# Patient Record
Sex: Female | Born: 1970 | Race: White | Hispanic: No | Marital: Married | Smoking: Former smoker
Health system: Southern US, Community
[De-identification: ages and names within clinical notes are randomized; demographics above are authoritative.]

## PROBLEM LIST (undated history)

## (undated) DIAGNOSIS — M329 Systemic lupus erythematosus, unspecified: Secondary | ICD-10-CM

## (undated) DIAGNOSIS — M1990 Unspecified osteoarthritis, unspecified site: Secondary | ICD-10-CM

## (undated) DIAGNOSIS — F32A Depression, unspecified: Secondary | ICD-10-CM

## (undated) DIAGNOSIS — E039 Hypothyroidism, unspecified: Secondary | ICD-10-CM

## (undated) DIAGNOSIS — M797 Fibromyalgia: Secondary | ICD-10-CM

## (undated) DIAGNOSIS — F988 Other specified behavioral and emotional disorders with onset usually occurring in childhood and adolescence: Secondary | ICD-10-CM

## (undated) DIAGNOSIS — I639 Cerebral infarction, unspecified: Secondary | ICD-10-CM

## (undated) DIAGNOSIS — F419 Anxiety disorder, unspecified: Secondary | ICD-10-CM

## (undated) DIAGNOSIS — T8859XA Other complications of anesthesia, initial encounter: Secondary | ICD-10-CM

## (undated) DIAGNOSIS — K5090 Crohn's disease, unspecified, without complications: Secondary | ICD-10-CM

## (undated) DIAGNOSIS — IMO0002 Reserved for concepts with insufficient information to code with codable children: Secondary | ICD-10-CM

## (undated) DIAGNOSIS — F329 Major depressive disorder, single episode, unspecified: Secondary | ICD-10-CM

## (undated) HISTORY — DX: Depression, unspecified: F32.A

## (undated) HISTORY — PX: ABDOMINAL HYSTERECTOMY: SHX81

## (undated) HISTORY — PX: ANKLE SURGERY: SHX546

## (undated) HISTORY — PX: ELBOW SURGERY: SHX618

## (undated) HISTORY — PX: HAND SURGERY: SHX662

## (undated) HISTORY — DX: Other specified behavioral and emotional disorders with onset usually occurring in childhood and adolescence: F98.8

## (undated) HISTORY — DX: Reserved for concepts with insufficient information to code with codable children: IMO0002

## (undated) HISTORY — DX: Major depressive disorder, single episode, unspecified: F32.9

## (undated) HISTORY — DX: Anxiety disorder, unspecified: F41.9

---

## 1995-07-13 HISTORY — PX: CHOLECYSTECTOMY: SHX55

## 1998-07-12 DIAGNOSIS — M797 Fibromyalgia: Secondary | ICD-10-CM

## 1998-07-12 HISTORY — DX: Fibromyalgia: M79.7

## 2000-07-12 DIAGNOSIS — K5090 Crohn's disease, unspecified, without complications: Secondary | ICD-10-CM

## 2000-07-12 HISTORY — DX: Crohn's disease, unspecified, without complications: K50.90

## 2006-07-12 HISTORY — PX: TOTAL ABDOMINAL HYSTERECTOMY W/ BILATERAL SALPINGOOPHORECTOMY: SHX83

## 2010-07-12 DIAGNOSIS — I639 Cerebral infarction, unspecified: Secondary | ICD-10-CM

## 2010-07-12 HISTORY — DX: Cerebral infarction, unspecified: I63.9

## 2012-03-24 ENCOUNTER — Encounter (HOSPITAL_COMMUNITY): Payer: Self-pay | Admitting: Emergency Medicine

## 2012-03-24 ENCOUNTER — Emergency Department (INDEPENDENT_AMBULATORY_CARE_PROVIDER_SITE_OTHER)

## 2012-03-24 ENCOUNTER — Emergency Department (INDEPENDENT_AMBULATORY_CARE_PROVIDER_SITE_OTHER)
Admission: EM | Admit: 2012-03-24 | Discharge: 2012-03-24 | Disposition: A | Discharge status: Home / Self Care | Source: Home / Self Care

## 2012-03-24 DIAGNOSIS — M778 Other enthesopathies, not elsewhere classified: Secondary | ICD-10-CM

## 2012-03-24 DIAGNOSIS — S7000XA Contusion of unspecified hip, initial encounter: Secondary | ICD-10-CM

## 2012-03-24 DIAGNOSIS — M65849 Other synovitis and tenosynovitis, unspecified hand: Secondary | ICD-10-CM

## 2012-03-24 DIAGNOSIS — M549 Dorsalgia, unspecified: Secondary | ICD-10-CM

## 2012-03-24 DIAGNOSIS — S300XXA Contusion of lower back and pelvis, initial encounter: Secondary | ICD-10-CM

## 2012-03-24 DIAGNOSIS — M65839 Other synovitis and tenosynovitis, unspecified forearm: Secondary | ICD-10-CM

## 2012-03-24 HISTORY — DX: Fibromyalgia: M79.7

## 2012-03-24 HISTORY — DX: Crohn's disease, unspecified, without complications: K50.90

## 2012-03-24 HISTORY — DX: Cerebral infarction, unspecified: I63.9

## 2012-03-24 MED ORDER — KETOROLAC TROMETHAMINE 60 MG/2ML IM SOLN
60.0000 mg | Freq: Once | INTRAMUSCULAR | Status: AC
  Administered 2012-03-24: 60 mg via INTRAMUSCULAR

## 2012-03-24 MED ORDER — CYCLOBENZAPRINE HCL 10 MG PO TABS: 10.0000 mg | ORAL_TABLET | Freq: Two times a day (BID) | ORAL | Status: AC | PRN

## 2012-03-24 MED ORDER — KETOROLAC TROMETHAMINE 60 MG/2ML IM SOLN
INTRAMUSCULAR | Status: AC
  Filled 2012-03-24: qty 2

## 2012-03-24 NOTE — ED Notes (Signed)
Pt c/o lower back pain x1 week... Says that on 03/11/12 she fell of a truck and landed on her bottom... Says she had a bruise on her left glut... Since then, she's noticed pain that shoots downward to her left leg and left foot... Has some numbness and weakness on her left leg... Has been limping and not been able to sleep well...  She's also c/o pain on her left wrist.

## 2012-03-24 NOTE — ED Provider Notes (Signed)
History     CSN: [id number]  Arrival date & time 03/24/12  1146   None     Chief Complaint  Patient presents with  . Back Pain    (Consider location/radiation/quality/duration/timing/severity/associated sxs/prior treatment) HPI Comments: Hx as per RN note: fell nearly 2 weeks ago onto buttocks off a truck. Received a large black bruise to the L  Buttock. She complains of persistent pain at base of spine/coccyx, L paralumbosacral musculature. The pain radiates to L anterior  Thigh pain, sharp shooting. Difficult to walk and utilize back due to pain. All movements make it worse.   L wrist pain for 2 weeks. Does not know if injured it or not. Sore to use it but with full ROM.    Past Medical History  Diagnosis Date  . Fibromyalgia   . Crohn's disease   . Stroke     Past Surgical History  Procedure Date  . Cholecystectomy   . Hand surgery   . Elbow surgery   . Ankle surgery   . Total abdominal hysterectomy w/ bilateral salpingoophorectomy     No family history on file.  History  Substance Use Topics  . Smoking status: Not on file  . Smokeless tobacco: Not on file  . Alcohol Use:     OB History    Grav Para Term Preterm Abortions TAB SAB Ect Mult Living                  Review of Systems  Constitutional: Positive for activity change. Negative for fever.  HENT: Negative.   Respiratory: Negative.   Gastrointestinal: Negative.   Genitourinary: Negative.   Musculoskeletal:       As in HPI   Skin: Negative.   Neurological: Positive for weakness. Negative for dizziness, tremors, syncope, facial asymmetry and headaches.    Allergies  Codeine; Geodon; and Latex  Home Medications   Current Outpatient Rx  Name Route Sig Dispense Refill  . CYCLOBENZAPRINE HCL 10 MG PO TABS Oral Take 1 tablet (10 mg total) by mouth 2 (two) times daily as needed for muscle spasms. 20 tablet 0    BP 121/73  Pulse 80  Temp 98.8 F (37.1 C) (Oral)  Resp 18  SpO2  100%  Physical Exam  Constitutional: She is oriented to person, place, and time. She appears well-developed and well-nourished. No distress.  Eyes: EOM are normal. Pupils are equal, round, and reactive to light.  Neck: Normal range of motion. Neck supple.  Pulmonary/Chest: Effort normal.  Musculoskeletal:       Decrease ROM of low back and L LE. Tenderness in L lower back, buttock musculature and over the coccyx. Ambulatory with a limp. Unable to lift L thigh while standing, more than a few inches.   Neurological: She is alert and oriented to person, place, and time.  Skin: Skin is warm and dry. No erythema.    ED Course  Procedures (including critical care time)  Labs Reviewed - No data to display Dg Sacrum/coccyx  03/24/2012  *RADIOLOGY REPORT*  Clinical Data: Back pain after fall  SACRUM AND COCCYX - 2+ VIEW  Comparison: None.  Findings: Sacroiliac joints are within normal limits.  Sacral alae are intact.  Normal sacral and coccygeal segmentation.  No overlying radiopaque foreign body.  IMPRESSION: No fracture identified.   Original Report Authenticated By: Harrel Lemon, M.D.    Dg Hip Complete Left  03/24/2012  *RADIOLOGY REPORT*  Clinical Data: Fall, left hip pain  LEFT  HIP - COMPLETE 2+ VIEW  Comparison: None.  Findings: Presumed bowel content over the left sacrum.  Mild left hip degenerative change identified.  No fracture or dislocation. No sacroiliac diastasis.  IMPRESSION: No left hip fracture or dislocation.   Original Report Authenticated By: Harrel Lemon, M.D.      1. Contusion, hip   2. Contusion of multiple sites of buttock   3. Back pain   4. Tendinitis of left wrist       MDM  Dg Sacrum/coccyx  03/24/2012  *RADIOLOGY REPORT*  Clinical Data: Back pain after fall  SACRUM AND COCCYX - 2+ VIEW  Comparison: None.  Findings: Sacroiliac joints are within normal limits.  Sacral alae are intact.  Normal sacral and coccygeal segmentation.  No overlying radiopaque  foreign body.  IMPRESSION: No fracture identified.   Original Report Authenticated By: Harrel Lemon, M.D.    Dg Hip Complete Left  03/24/2012  *RADIOLOGY REPORT*  Clinical Data: Fall, left hip pain  LEFT HIP - COMPLETE 2+ VIEW  Comparison: None.  Findings: Presumed bowel content over the left sacrum.  Mild left hip degenerative change identified.  No fracture or dislocation. No sacroiliac diastasis.  IMPRESSION: No left hip fracture or dislocation.   Original Report Authenticated By: Harrel Lemon, M.D.    Motrin for pain Flexeril bid prn.  L wrist splint Crutches for 4-5 days with gradual wt bearing as tolerated.          Hayden Rasmussen, NP 03/24/12 1410

## 2012-03-25 NOTE — ED Provider Notes (Signed)
Medical screening examination/treatment/procedure(s) were performed by non-physician practitioner and as supervising physician I was immediately available for consultation/collaboration.  Andruw Battie   Aynslee Mulhall, MD 03/25/12 1144 

## 2013-02-20 ENCOUNTER — Other Ambulatory Visit: Payer: Self-pay | Admitting: Family Medicine

## 2013-02-20 DIAGNOSIS — G459 Transient cerebral ischemic attack, unspecified: Secondary | ICD-10-CM

## 2013-02-23 ENCOUNTER — Ambulatory Visit
Admission: RE | Admit: 2013-02-23 | Discharge: 2013-02-23 | Disposition: A | Discharge status: Home / Self Care | Source: Ambulatory Visit | Attending: Family Medicine | Admitting: Family Medicine

## 2013-02-23 ENCOUNTER — Other Ambulatory Visit

## 2013-02-23 DIAGNOSIS — G459 Transient cerebral ischemic attack, unspecified: Secondary | ICD-10-CM

## 2013-09-21 ENCOUNTER — Encounter: Payer: Self-pay | Admitting: Gynecology

## 2013-09-21 ENCOUNTER — Ambulatory Visit (INDEPENDENT_AMBULATORY_CARE_PROVIDER_SITE_OTHER): Payer: BC Managed Care – PPO | Admitting: Gynecology

## 2013-09-21 VITALS — BP 104/75 | HR 82 | Resp 16 | Ht 61.5 in | Wt 159.0 lb

## 2013-09-21 DIAGNOSIS — E2839 Other primary ovarian failure: Secondary | ICD-10-CM

## 2013-09-21 DIAGNOSIS — F32A Depression, unspecified: Secondary | ICD-10-CM | POA: Insufficient documentation

## 2013-09-21 DIAGNOSIS — Z0000 Encounter for general adult medical examination without abnormal findings: Secondary | ICD-10-CM

## 2013-09-21 DIAGNOSIS — F329 Major depressive disorder, single episode, unspecified: Secondary | ICD-10-CM | POA: Insufficient documentation

## 2013-09-21 DIAGNOSIS — K5090 Crohn's disease, unspecified, without complications: Secondary | ICD-10-CM

## 2013-09-21 DIAGNOSIS — Z01419 Encounter for gynecological examination (general) (routine) without abnormal findings: Secondary | ICD-10-CM

## 2013-09-21 DIAGNOSIS — IMO0002 Reserved for concepts with insufficient information to code with codable children: Secondary | ICD-10-CM

## 2013-09-21 LAB — POCT URINALYSIS DIPSTICK
PH UA: 5
UROBILINOGEN UA: NEGATIVE

## 2013-09-21 MED ORDER — LIDOCAINE 5 % EX OINT
1.0000 | TOPICAL_OINTMENT | Freq: Three times a day (TID) | CUTANEOUS | Status: DC
Start: 2013-09-21 — End: 2015-12-10

## 2013-09-21 NOTE — Progress Notes (Signed)
Nichole Aguilar   [name] here with spouse. Pt is not currently sexually active.  Pt referred for dyspareunia.  Pt reports symptoms have been progressive and not cannot attain vaginal penetration.  She has had 4 children vaginally. They have not tried lubricants, but have.  Pt describes pain as "shard of glass" Pt is s/p LAVH for recurrent ovarian cysts in 2007, and was on estradiol until 2012.  Pt stopped due to stroke-twice both with left sided with almost full return to normal.  Pt reports some aphasia still and wonders if she has mini TIA's.   Pt intermittent breast exams, does have mammograms.  canot recall if she had full PE including breast recently.  No LMP recorded. Patient has had a hysterectomy.          Sexually active: no  The current method of family planning is status post hysterectomy.    Exercising: no   Last pap: Alcohol: no Tobacco: no BSE: no Mammogram: 2014  Hgb: not done yet ; Urine: Leuks 1    No health maintenance topics applied.  Family History  Problem Relation Age of Onset  . Hodgkin's lymphoma Mother   . Breast cancer Maternal Aunt   . Hypertension Father   . Osteoporosis      Paternal side    Patient Active Problem List   Diagnosis Date Noted  . Depression     Past Medical History  Diagnosis Date  . Fibromyalgia   . Crohn's disease   . Stroke   . Depression   . Anxiety   . Dyspareunia     Past Surgical History  Procedure Laterality Date  . Cholecystectomy    . Hand surgery    . Elbow surgery    . Ankle surgery    . Total abdominal hysterectomy w/ bilateral salpingoophorectomy  2008    Allergies: Codeine; Geodon; and Latex  Current Outpatient Prescriptions  Medication Sig Dispense Refill  . amphetamine-dextroamphetamine (ADDERALL) 20 MG tablet Take 20 mg by mouth 2 (two) times daily.      Marland Kitchen. ALPRAZolam (XANAX) 1 MG tablet as needed.        No current facility-administered medications for this visit.    ROS:  Pertinent items are noted in HPI.  Exam:    BP 104/75  Pulse 82  Resp 16  Ht 5' 1.5" (1.562 m)  Wt 159 lb (72.122 kg)  BMI 29.56 kg/m2 Weight change: @WEIGHTCHANGE @ Last 3 height recordings:  Ht Readings from Last 3 Encounters:  09/21/13 5' 1.5" (1.562 m)   General appearance: alert, cooperative and appears older than stated age, she is at times slow with responses and unsure of response. Head: Normocephalic, without obvious abnormality, atraumatic Lungs: clear to auscultation bilaterally Breasts: normal appearance, no masses or tenderness Heart: regular rate and rhythm, S1, S2 normal, no murmur, click, rub or gallop Abdomen: soft, non-tender; bowel sounds normal; no masses,  no organomegaly Extremities: nodes noted on fingers, no skin discoloration, tender Skin: Skin color, texture, turgor normal. No rashes or lesions Lymph nodes: Cervical, supraclavicular, and axillary nodes normal. no inguinal nodes palpated Neurologic: Grossly normal   Pelvic: External genitalia:  no lesions              Urethra: normal appearing urethra with no masses, tenderness or lesions              Bartholins and Skenes: Bartholin's, Urethra, Skene's normal  Vagina: diffuse tenderness limiting exam, petechiae noted, tenderness more on left side than right, narrow introitus              Cervix:absent                      Bimanual Exam:  Uterus:  absent                                      Adnexa:    no masses                                      Rectovaginal: ridge c/w posterior repair tender diffusely, levator tenderness left greater than right                                      Anus:  normal sphincter tone, no lesions  A: dyspareunia hypoestrogen state Crohn's disease  P: we had a long discussion regarding the differential for her pain which may be multifactorial.  She is hypoestrogenic, in addition, she has not been on medications for her crohn's disease in over 2y and she has had  muscular weakness on the left after her stokes all of which can contribute to her pain.  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  Her exam was limited by tenderness.  We have prescribed lidocaine ointment and have asked her to bring it back for a follow up exam.  Because of her strokes, I would not recommend vaginal estrogen at this time.  We discussed cocoanut oil as a lubricant.   We referred her to GI for management of her Crohn's. We requested records regarding We discussed many treatment options but will be more specific once we have all of her records   An After Visit Summary was printed and given to the patient.

## 2013-09-21 NOTE — Patient Instructions (Signed)
Follow up with GI Return with lidocaine ointment for f/u visit Call before office visit to assure records in

## 2013-10-01 ENCOUNTER — Telehealth: Payer: Self-pay | Admitting: Gynecology

## 2013-10-01 NOTE — Telephone Encounter (Signed)
Per fax received from Dr Trilby DrummerManns office. Patient is scheduled 04.07.2015 @ 1045. Patient has been advised

## 2013-10-19 ENCOUNTER — Encounter: Payer: Self-pay | Admitting: Gynecology

## 2013-10-19 ENCOUNTER — Telehealth: Payer: Self-pay | Admitting: Gynecology

## 2013-10-19 ENCOUNTER — Ambulatory Visit: Payer: BC Managed Care – PPO | Admitting: Gynecology

## 2013-10-19 NOTE — Telephone Encounter (Signed)
Patient dnka her 4 week reck appointment today. I spoke with patient she rescheduled to 11/02/2013 @ 2:30 with Dr.Lathrop.

## 2013-10-22 NOTE — Telephone Encounter (Signed)
Never got her old records, can we resend out the request? TL

## 2013-11-02 ENCOUNTER — Encounter: Payer: Self-pay | Admitting: Gynecology

## 2013-11-02 ENCOUNTER — Encounter: Payer: BC Managed Care – PPO | Admitting: Gynecology

## 2013-11-06 ENCOUNTER — Ambulatory Visit (INDEPENDENT_AMBULATORY_CARE_PROVIDER_SITE_OTHER): Payer: BC Managed Care – PPO | Admitting: Gynecology

## 2013-11-06 VITALS — BP 102/64 | HR 68 | Resp 16 | Ht 61.5 in | Wt 162.0 lb

## 2013-11-06 DIAGNOSIS — K5090 Crohn's disease, unspecified, without complications: Secondary | ICD-10-CM

## 2013-11-06 DIAGNOSIS — I639 Cerebral infarction, unspecified: Secondary | ICD-10-CM

## 2013-11-06 DIAGNOSIS — I6350 Cerebral infarction due to unspecified occlusion or stenosis of unspecified cerebral artery: Secondary | ICD-10-CM

## 2013-11-06 DIAGNOSIS — IMO0002 Reserved for concepts with insufficient information to code with codable children: Secondary | ICD-10-CM

## 2013-11-06 DIAGNOSIS — E2839 Other primary ovarian failure: Secondary | ICD-10-CM

## 2013-11-06 MED ORDER — LIDOCAINE HCL 2 % EX GEL: 1.0000 "application " | CUTANEOUS | Status: DC | PRN

## 2013-11-06 NOTE — Progress Notes (Signed)
Pt his here for a limited exam with anesthesia.  She has her lidocaine ointment 5%. We have not yet gotten her old reocrds of her stroke or LAVH-BSO.  Pt and husband have not been able to have vaginal penetration.    Pt presented with lidocaine-peppermint oil noted on package Vagina treated with 2% lidocaine jelly. After some time, pelvic exam able to be performed.   External genitalia:  See above, raw area noted posteriorly BUS: negative Vagina: pale, thin with petechiae, no gross lesions, no discharge, no malodor, anterior vaginal tenderness Uterus, cervix surgically absent Adnexa negative: bilateral tenderness  Rectovaginal:  exquisite tenderness  Assessment: Post-menopausal vaginal atrophy Crohn's disease not treated Stroke Fibromyalgia  Plan; By anesthetizing the vagina we were able to determine that her pelvic pain is rectal in source Strongly recommend she f/u with GI to restart treatment and evaluation of her crohn's  Pt aware that crohns can be source of her stroke, she cannot use estrogen however due to coagulable state Recommend returning xylocaine for non-mint variety, can use with condoms for intercourse, 2% rx given today. Vaginal

## 2013-11-21 ENCOUNTER — Ambulatory Visit: Payer: BC Managed Care – PPO | Admitting: Neurology

## 2013-12-07 NOTE — Progress Notes (Signed)
appt canceled after pt presented, did not have medication with her

## 2013-12-13 ENCOUNTER — Encounter (INDEPENDENT_AMBULATORY_CARE_PROVIDER_SITE_OTHER): Payer: Self-pay

## 2013-12-13 ENCOUNTER — Encounter: Payer: Self-pay | Admitting: Neurology

## 2013-12-13 ENCOUNTER — Ambulatory Visit (INDEPENDENT_AMBULATORY_CARE_PROVIDER_SITE_OTHER): Payer: BC Managed Care – PPO | Admitting: Neurology

## 2013-12-13 VITALS — BP 114/77 | HR 88 | Resp 16 | Ht 62.0 in | Wt 162.0 lb

## 2013-12-13 DIAGNOSIS — F5105 Insomnia due to other mental disorder: Secondary | ICD-10-CM

## 2013-12-13 DIAGNOSIS — F489 Nonpsychotic mental disorder, unspecified: Secondary | ICD-10-CM

## 2013-12-13 DIAGNOSIS — K5090 Crohn's disease, unspecified, without complications: Secondary | ICD-10-CM | POA: Insufficient documentation

## 2013-12-13 DIAGNOSIS — Z9189 Other specified personal risk factors, not elsewhere classified: Secondary | ICD-10-CM

## 2013-12-13 DIAGNOSIS — G4750 Parasomnia, unspecified: Secondary | ICD-10-CM

## 2013-12-13 DIAGNOSIS — F409 Phobic anxiety disorder, unspecified: Secondary | ICD-10-CM

## 2013-12-13 NOTE — Patient Instructions (Signed)
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
## 2013-12-25 ENCOUNTER — Ambulatory Visit
Admission: RE | Admit: 2013-12-25 | Discharge: 2013-12-25 | Disposition: A | Discharge status: Home / Self Care | Payer: BC Managed Care – PPO | Source: Ambulatory Visit | Attending: Neurology | Admitting: Neurology

## 2013-12-25 DIAGNOSIS — F409 Phobic anxiety disorder, unspecified: Secondary | ICD-10-CM

## 2013-12-25 DIAGNOSIS — F5105 Insomnia due to other mental disorder: Secondary | ICD-10-CM

## 2013-12-25 DIAGNOSIS — Z9189 Other specified personal risk factors, not elsewhere classified: Secondary | ICD-10-CM

## 2013-12-25 DIAGNOSIS — G4750 Parasomnia, unspecified: Secondary | ICD-10-CM

## 2013-12-25 DIAGNOSIS — K5090 Crohn's disease, unspecified, without complications: Secondary | ICD-10-CM

## 2013-12-25 MED ORDER — GADOBENATE DIMEGLUMINE 529 MG/ML IV SOLN
15.0000 mL | Freq: Once | INTRAVENOUS | Status: AC | PRN
  Administered 2013-12-25: 15 mL via INTRAVENOUS

## 2014-01-04 NOTE — Progress Notes (Signed)
Quick Note:  Spoke to patient and relayed MRI brain results, per Dr. Dohmeier. ______ 

## 2014-01-30 ENCOUNTER — Ambulatory Visit (INDEPENDENT_AMBULATORY_CARE_PROVIDER_SITE_OTHER): Payer: BC Managed Care – PPO | Admitting: Neurology

## 2014-01-30 ENCOUNTER — Encounter: Payer: Self-pay | Admitting: Neurology

## 2014-01-30 DIAGNOSIS — G4700 Insomnia, unspecified: Secondary | ICD-10-CM

## 2014-01-30 DIAGNOSIS — F5105 Insomnia due to other mental disorder: Secondary | ICD-10-CM

## 2014-01-30 DIAGNOSIS — F409 Phobic anxiety disorder, unspecified: Secondary | ICD-10-CM

## 2014-01-30 DIAGNOSIS — G4750 Parasomnia, unspecified: Secondary | ICD-10-CM

## 2014-01-30 DIAGNOSIS — R0989 Other specified symptoms and signs involving the circulatory and respiratory systems: Secondary | ICD-10-CM

## 2014-01-30 DIAGNOSIS — R0609 Other forms of dyspnea: Secondary | ICD-10-CM

## 2014-01-30 DIAGNOSIS — K5090 Crohn's disease, unspecified, without complications: Secondary | ICD-10-CM

## 2014-02-04 ENCOUNTER — Ambulatory Visit: Payer: BC Managed Care – PPO | Admitting: Neurology

## 2014-02-07 ENCOUNTER — Ambulatory Visit: Payer: Self-pay | Admitting: Neurology

## 2014-02-09 ENCOUNTER — Telehealth: Payer: Self-pay | Admitting: Neurology

## 2014-02-09 NOTE — Telephone Encounter (Signed)
Called the patient and left a message on voicemail informing her that her sleep study results were normal.  The test did not show or reveal any obstructive sleep apnea.  A copy of this report was sent to Dr. Douglass Riversracy Lathrop and will be mailed to the pt's home address.

## 2014-02-19 ENCOUNTER — Ambulatory Visit: Payer: BC Managed Care – PPO | Admitting: Neurology

## 2014-03-04 ENCOUNTER — Encounter: Payer: Self-pay | Admitting: Neurology

## 2014-04-01 ENCOUNTER — Emergency Department (HOSPITAL_COMMUNITY)
Admission: EM | Admit: 2014-04-01 | Discharge: 2014-04-01 | Disposition: A | Discharge status: Home / Self Care | Payer: BC Managed Care – PPO | Attending: Emergency Medicine | Admitting: Emergency Medicine

## 2014-04-01 ENCOUNTER — Emergency Department (HOSPITAL_COMMUNITY): Payer: BC Managed Care – PPO

## 2014-04-01 ENCOUNTER — Encounter (HOSPITAL_COMMUNITY): Payer: Self-pay | Admitting: Emergency Medicine

## 2014-04-01 DIAGNOSIS — Z79899 Other long term (current) drug therapy: Secondary | ICD-10-CM | POA: Insufficient documentation

## 2014-04-01 DIAGNOSIS — M25579 Pain in unspecified ankle and joints of unspecified foot: Secondary | ICD-10-CM | POA: Diagnosis present

## 2014-04-01 DIAGNOSIS — F411 Generalized anxiety disorder: Secondary | ICD-10-CM | POA: Insufficient documentation

## 2014-04-01 DIAGNOSIS — F329 Major depressive disorder, single episode, unspecified: Secondary | ICD-10-CM | POA: Diagnosis not present

## 2014-04-01 DIAGNOSIS — M25476 Effusion, unspecified foot: Secondary | ICD-10-CM | POA: Insufficient documentation

## 2014-04-01 DIAGNOSIS — Z9889 Other specified postprocedural states: Secondary | ICD-10-CM | POA: Diagnosis not present

## 2014-04-01 DIAGNOSIS — Z8742 Personal history of other diseases of the female genital tract: Secondary | ICD-10-CM | POA: Insufficient documentation

## 2014-04-01 DIAGNOSIS — Z8719 Personal history of other diseases of the digestive system: Secondary | ICD-10-CM | POA: Insufficient documentation

## 2014-04-01 DIAGNOSIS — M25571 Pain in right ankle and joints of right foot: Secondary | ICD-10-CM

## 2014-04-01 DIAGNOSIS — M25473 Effusion, unspecified ankle: Secondary | ICD-10-CM | POA: Diagnosis not present

## 2014-04-01 DIAGNOSIS — Z91040 Latex allergy status: Secondary | ICD-10-CM | POA: Insufficient documentation

## 2014-04-01 DIAGNOSIS — F3289 Other specified depressive episodes: Secondary | ICD-10-CM | POA: Diagnosis not present

## 2014-04-01 DIAGNOSIS — Z8673 Personal history of transient ischemic attack (TIA), and cerebral infarction without residual deficits: Secondary | ICD-10-CM | POA: Diagnosis not present

## 2014-04-01 LAB — CBG MONITORING, ED: Glucose-Capillary: 90 mg/dL (ref 70–99)

## 2014-04-01 MED ORDER — OXYCODONE-ACETAMINOPHEN 5-325 MG PO TABS: ORAL_TABLET | ORAL | Status: DC

## 2014-04-01 MED ORDER — PROMETHAZINE HCL 25 MG PO TABS: 25.0000 mg | ORAL_TABLET | Freq: Four times a day (QID) | ORAL | Status: DC | PRN

## 2014-04-01 MED ORDER — ONDANSETRON 4 MG PO TBDP
4.0000 mg | ORAL_TABLET | Freq: Once | ORAL | Status: AC
  Administered 2014-04-01: 4 mg via ORAL
  Filled 2014-04-01: qty 1

## 2014-04-01 MED ORDER — OXYCODONE-ACETAMINOPHEN 5-325 MG PO TABS
1.0000 | ORAL_TABLET | Freq: Once | ORAL | Status: AC
  Administered 2014-04-01: 1 via ORAL
  Filled 2014-04-01: qty 1

## 2014-04-01 NOTE — Discharge Instructions (Signed)
Take percocet for breakthrough pain, do not drink alcohol, drive, care for children or do other critical tasks while taking percocet.  Do not weight bear until you're cleared by the orthopedist Dr. August Saucer.  Do not hesitate to return to the emergency room for any new, worsening or concerning symptoms.  Please obtain primary care using resource guide below. But the minute you were seen in the emergency room and that they will need to obtain records for further outpatient management.     Emergency Department Resource Guide 1) Find a Doctor and Pay Out of Pocket Although you won't have to find out who is covered by your insurance plan, it is a good idea to ask around and get recommendations. You will then need to call the office and see if the doctor you have chosen will accept you as a new patient and what types of options they offer for patients who are self-pay. Some doctors offer discounts or will set up payment plans for their patients who do not have insurance, but you will need to ask so you aren't surprised when you get to your appointment.  2) Contact Your Local Health Department Not all health departments have doctors that can see patients for sick visits, but many do, so it is worth a call to see if yours does. If you don't know where your local health department is, you can check in your phone book. The CDC also has a tool to help you locate your state's health department, and many state websites also have listings of all of their local health departments.  3) Find a Walk-in Clinic If your illness is not likely to be very severe or complicated, you may want to try a walk in clinic. These are popping up all over the country in pharmacies, drugstores, and shopping centers. They're usually staffed by nurse practitioners or physician assistants that have been trained to treat common illnesses and complaints. They're usually fairly quick and inexpensive. However, if you have serious medical  issues or chronic medical problems, these are probably not your best option.  No Primary Care Doctor: - Call Health Connect at  [phone] - they can help you locate a primary care doctor that  accepts your insurance, provides certain services, etc. - Physician Referral Service- [phone]  Chronic Pain Problems: Organization         Address  Phone   Notes  [facility]  [phone] Patients need to be referred by their primary care doctor.   Medication Assistance: Organization         Address  Phone   Notes  [facility] Medication [facility] [address] [place], [place] [address] [phone] --Must be a resident of [facility] -- Must have NO insurance coverage whatsoever (no Medicaid/ Medicare, etc.) -- The pt. MUST have a primary care doctor that directs their care regularly and follows them in the community   [facility]  [phone]   [employer]  [phone]    Agencies that provide inexpensive medical care: Organization         Address  Phone   Notes  [facility]  [phone]   [facility]    [phone]   [facility] [address] [place], [place] [address] [phone]   [facility] [address] [place] [place] [phone]   [facility]anned Parenthood    [phone]  [facility]    [phone]   [facility]  [address], [place] Phone:  [phone], Fax:  [phone] Hours of Operation:  9 am - 6 pm, M-F.  Also accepts Medicaid/Medicare and self-pay.  [facility] for [place], [address], [place] Phone: [phone], Fax: [phone]. Hours of Operation:  8:30 am - 5:30 pm, M-F.  Also accepts Medicaid and self-pay.  [facility] [place] [address], [place] Phone: [phone]   [place], [place], [place]  [phone], Ext. [phone] Mondays & Thursdays: 7-9 AM.  First 15 patients are seen on a first come, first serve basis.    [place] Providers:  Organization         Address  Phone   Notes  [facility] [address], [place] [phone] Also accepts self-pay patients.  [facility] [address], [place]  [phone]   [place], [address], [place] [phone]   [facility] [address] [place] [phone]   Lucianne Lei [address], [place]   [phone] Only accepts [place] Access [place] patients after they have their name applied to their card.   Self-Pay (no insurance) in [facility]:  Organization         Address  Phone   Notes  Sickle Cell Patients, [facility] [place] [phone]   [facility] [place] [phone]   [facility] [place]  [place], [place], [place] [phone]   [facility]/Dr. Osei-Bonsu  [address] [place] or [address], [place] [phone] Phone number for both [place] and [place] locations is the same.  [facility] [address] [place] [phone]   [facility] [address] [place] or [address] [phone]   [facility] [address], [place] [phone], phone; [phone], fax Sees patients 1st and 3rd Saturday of every month.  Must not qualify for public or private insurance (i.e. Medicaid, Medicare, [place] Health Choice, Veterans' Benefits)  Household income should be no more than 200% of the poverty level The clinic cannot treat you if you are pregnant or think you are pregnant  Sexually transmitted  diseases are not treated at the clinic.    Dental Care: Organization         Address  Phone  Notes  [facility] [place] [phone] Accepts children up to age 15 who are enrolled in [place] or [place]; pregnant women with a Medicaid card; and children who have applied for Medicaid or [place] Health Choice, but were declined, whose parents can pay a reduced fee at time of service.  [facility]  [address], [place] [phone] Accepts children up to age 40 who are enrolled in [place] or [place]; pregnant women with a Medicaid card; and children who have applied for Medicaid or [place] Health Choice, but were declined, whose parents can pay a reduced fee at time  of service.  °[facility] ° [address], [place] [phone] Patients are seen by appointment only. Walk-ins are not accepted. [facility] will see patients 18 years of age and older. °Monday - Tuesday (8am-5pm) °Most Wednesdays (8:30-5pm) °$30 per visit, cash only  °[facility] ° [address], [place] [phone] Patients are seen by appointment only. Walk-ins are not accepted. [facility] will see patients 18 years of age and older. °One Wednesday Evening (Monthly: Volunteer Based).  $30 per visit, cash only  °[facility]  [phone] for adults; Children under age 4, call Graduate Pediatric Dentistry at [phone]. Children aged 4-14, please call [phone] to request a pediatric application. ° Dental services are provided in all areas of dental care including fillings, crowns and bridges, complete and partial dentures, implants, gum treatment, root canals, and extractions. Preventive care is also provided. Treatment is provided to both adults and children. °Patients are selected via a  lottery and there is often a waiting list. °  °[facility] [address], °[place] ° ([phone] www.drcivils.com °  °[facility] [address], [place], [place] ([phone], Ext. [phone] Second and Fourth Thursday of each month, opens at 6:30 AM; Clinic ends at 9 AM.  Patients are seen on a first-come first-served basis, and a limited number are seen during each clinic.  ° °[facility] ° [address], [place], [place] [phone]   Eligibility Requirements °You must have lived in [place], [place], or [place] counties for at least the last three months. °  You cannot be eligible for state or federal sponsored healthcare insurance, including Veterans Administration, Medicaid, or Medicare. °  You generally cannot be eligible for healthcare insurance through your employer.  °  How to apply: °Eligibility screenings are held every Tuesday and Wednesday afternoon from 1:00 pm until 4:00 pm. You do not need an appointment for the interview!  °[facility] [address], [place], [place] [phone]   °[facility]  [phone]   °[facility]  [phone]   °[facility]  336-570-6415   ° °Behavioral Health Resources in the Community: °Intensive Outpatient Programs °Organization         Address  Phone  Notes  °[facility] [address], [place], [place] [phone]   °[facility] [address], [place], [place] [phone]   °[facility] [address], [place], [place] ° [phone]   °[facility] [address],  °[place], [place] 1-800-853-5163 or 336-641-4981   °Substance Abuse Resources °Organization         Address  Phone  Notes  °[facility]  336-882-2125   °[facility]  336-784-9470   °[facility]  336-285-9073   °[facility]  336-845-3988   °Residential &  Outpatient Substance Abuse Program  1-800-659-3381   °Psychological Services °Organization         Address  Phone  Notes  °[facility]  336- [phone]   °[facility]  336- 378-7881   °[facility] [address], [place] 1-800-853-5163 or 336-641-4981   ° °Mobile Crisis Teams °Organization         Address  Phone  Notes  °[facility], Mobile Crisis Care Unit  1-877-626-1772   °Assertive °Psychotherapeutic   Services  [address] [place], [place] [phone]   [facility] [address] [place] [phone]    Self-Help/Support Groups Organization         Address  Phone             Notes  [facility] [place] - variety of support groups  336- I7437963 Call for more information  Narcotics Anonymous (NA), [facility] [address], [employer] [place]  2 meetings at this location   Statistician         Address  Phone  Notes  [facility] [address],    [place]  [phone]   [facility]  [address], [place] [address], [place], [place] [phone]   [facility] [address] [place], [place] [phone] Admissions: 8am-3pm M-F  [facility] [address]    [place], [place] [phone]   [facility] [address] [place], [place], [place] [phone]   [facility] [address]  [place], [place] [phone]   [facility] Intensive Outpatient [address], [place], [place] [phone]   [facility] ([facility] [address] [place]  [place], [place] 9-323-557-3220 or [phone]   [facility] ([facility]) [address], [place], [place] [phone] Accepts Medicaid  [facility] [place] [address]  [place] [phone] Substance Abuse/Addiction Treatment   [facility] Organization          Address  Phone  Notes  [facility]  [phone]   Angie Fava, PhD [address] [place], [place]   [phone] or [phone]   [facility]al   [address] [place], [place] [phone]   [facility] [address] [place], [place] [phone] Insurance/Medicaid/sponsorship through [facility] [address]                                    [place], [place] [phone] Therapy/tele-psych/case  [facility] [address], [place] [phone]    Dr. Lolly Mustache  [phone]   [facility]  [employer] [facility] Dept. 1) [address] [place] 2) [address] [place] 3)  [address] [place], [place] [phone]  8648853182   [facility] Child Abuse Hotline (236) 016-1376 or [phone] (After Hours)

## 2014-04-01 NOTE — Progress Notes (Signed)
Orthopedic Tech Progress Note Patient Details:  Nichole Aguilar Sep 24, 1970 [id number]  Ortho Devices Type of Ortho Device: Ace wrap;Crutches;Post (short leg) splint Ortho Device/Splint Location: rle Ortho Device/Splint Interventions: Application   Nichole Aguilar 04/01/2014, 6:24 PM

## 2014-04-01 NOTE — Progress Notes (Signed)
Orthopedic Tech Progress Note Patient Details:  Nichole Aguilar 1971/06/18 [id number]  Ortho Devices Type of Ortho Device: Ace wrap;Crutches;Post (short leg) splint Ortho Device/Splint Location: rle Ortho Device/Splint Interventions: Application Viewed order from doctor's order list  [name] 04/01/2014, 6:26 PM

## 2014-04-01 NOTE — ED Provider Notes (Signed)
CSN: [id number]     Arrival date & time 04/01/14  1400 History  This chart was scribed for non-physician practitioner, Jaynie Crumble, PA-C working with Mirian Mo, MD by Greggory Stallion, ED scribe. This patient was seen in room TR08C/TR08C and the patient's care was started at 3:35 PM.   Chief Complaint  Patient presents with  . Foot Pain   The history is provided by the patient. No language interpreter was used.   HPI Comments: Nichole Aguilar is a 43 y.o. female who presents to the Emergency Department complaining of constant throbbing right foot and ankle pain with associated swelling that started this morning when she woke up. Denies known injury but states she has slept walked in the past and might have done it last night. Bearing weight worsens the pain and causes it to be short. Pt has not done anything for her symptoms yet. Denies calf pain. Reports past injury and surgery to her right ankle. States the surgery was 4-5 years ago.   Past Medical History  Diagnosis Date  . Fibromyalgia 2000  . Crohn's disease 2002  . Stroke 2012    left sided weakness  . Depression   . Anxiety   . Dyspareunia   . Crohn's disease 12/13/2013   Past Surgical History  Procedure Laterality Date  . Cholecystectomy  1997    laparoscopic  . Hand surgery    . Elbow surgery    . Ankle surgery    . Total abdominal hysterectomy w/ bilateral salpingoophorectomy  2008    recurrent ovarian cysts  . Abdominal hysterectomy     Family History  Problem Relation Age of Onset  . Hodgkin's lymphoma Mother   . Breast cancer Maternal Aunt   . Hypertension Father   . Osteoporosis      Paternal side  . Diabetes type II Father   . Stroke Mother   . Cancer Father    History  Substance Use Topics  . Smoking status: Former Smoker    Quit date: 07/12/2010  . Smokeless tobacco: Never Used  . Alcohol Use: Yes     Comment: rarely   OB History   Grav Para Term Preterm Abortions TAB SAB Ect Mult Living   Review of Systems  Musculoskeletal: Positive for arthralgias and joint swelling.  All other systems reviewed and are negative.  Allergies  Codeine; Geodon; Latex; and Tagamet  Home Medications   Prior to Admission medications   Medication Sig Start Date End Date Taking? Authorizing Provider  ALPRAZolam Prudy Feeler) 1 MG tablet Take 1 mg by mouth as needed.  09/20/13   Historical Provider, MD  amphetamine-dextroamphetamine (ADDERALL XR) 20 MG 24 hr capsule Take 40 mg by mouth daily.    Historical Provider, MD  citalopram (CELEXA) 40 MG tablet Take 40 mg by mouth daily.  09/21/13   Historical Provider, MD  lidocaine (XYLOCAINE JELLY) 2 % jelly Apply 1 application topically as needed. As needed 11/06/13   Bennye Alm, MD  lidocaine (XYLOCAINE) 5 % ointment Apply 1 application topically 3 (three) times daily. 09/21/13   Bennye Alm, MD  SYNTHROID 50 MCG tablet Take 50 mcg by mouth daily before breakfast.  09/21/13   Historical Provider, MD   BP 111/75  Pulse 98  Temp(Src) 98 F (36.7 C) (Oral)  Resp 18  SpO2 100%  Physical Exam  Nursing note and vitals reviewed. Constitutional: She is  oriented to person, place, and time. She appears well-developed and well-nourished. No distress.  HENT:  Head: Normocephalic and atraumatic.  Eyes: Conjunctivae and EOM are normal.  Neck: Neck supple. No tracheal deviation present.  Cardiovascular: Normal rate.   Pulmonary/Chest: Effort normal. No respiratory distress.  Musculoskeletal: Normal range of motion.  Normal appearing right ankle and foot with no obvious swelling, erythema, bruising, deformity. Tenderness to palpation over lateral malleolus, the dorsal foot, specifically over third, fourth, fifth metatarsals. Tenderness over plantar fascia. Tenderness over third, fourth, fifth MCP joints. Pain with any range of motion of the ankle, however is able to passively tolerate some range of motion. Joint is stable. Foot is pink, warm,  Refill less than 2 seconds distally.  Neurological: She is alert and oriented to person, place, and time.  Skin: Skin is warm and dry.  Psychiatric: She has a normal mood and affect. Her behavior is normal.    ED Course  Procedures (including critical care time)  DIAGNOSTIC STUDIES: Oxygen Saturation is 100% on RA, normal by my interpretation.    COORDINATION OF CARE: 3:39 PM-Discussed treatment plan which includes xray with pt at bedside and pt agreed to plan.   Labs Review Labs Reviewed - No data to display  Imaging Review Dg Ankle Complete Right  04/01/2014   CLINICAL DATA:  Pain  EXAM: RIGHT ANKLE - COMPLETE 3+ VIEW  COMPARISON:  None.  FINDINGS: Frontal, oblique, and lateral views were obtained. There is no fracture or effusion. Ankle mortise appears intact. No erosive change.  IMPRESSION: No fracture or appreciable arthropathic change.   Electronically Signed   By: Bretta Bang M.D.   On: 04/01/2014 16:01   Dg Foot Complete Right  04/01/2014   CLINICAL DATA:  Pain.  EXAM: RIGHT FOOT COMPLETE - 3+ VIEW  COMPARISON:  None.  FINDINGS: There is no evidence of fracture or dislocation. There is no evidence of arthropathy or other focal bone abnormality. Soft tissues are unremarkable.  IMPRESSION: No acute bony or joint abnormality .   Electronically Signed   By: Maisie Fus  Register   On: 04/01/2014 16:00     EKG Interpretation None      MDM   Final diagnoses:  Arthralgia of right foot    Atraumatic, possibly while sleepwalking injury, to the right foot and ankle. X-rays negative. Discussed with Dr. Littie Deeds, who advised to get stress x-rays, and standing position of the foot. This is to rule out Lisfranc injury. If negative, splint, pain medications, followup with orthopedics doctor.   5:27 PM Signed out at shift change pending stress x-ray.   Filed Vitals:   04/01/14 1425  BP: 111/75  Pulse: 98  Temp: 98 F (36.7 C)  TempSrc: Oral  Resp: 18  SpO2: 100%     I  personally performed the services described in this documentation, which was scribed in my presence. The recorded information has been reviewed and is accurate.  Lottie Mussel, PA-C 04/02/14 1637

## 2014-04-01 NOTE — ED Notes (Signed)
Pt reports awakening this AM with "throbbing" to right foot. Reports prior fractures to that foot. Denies recent injury. States "I sometimes walk in my sleep, and I feel like I could have maybe hurt it last night doing that." Pulses/sensation intact. No obvious injury. Denies taking anything for pain.

## 2014-04-06 NOTE — ED Provider Notes (Signed)
Medical screening examination/treatment/procedure(s) were conducted as a shared visit with non-physician practitioner(s) and myself.  I personally evaluated the patient during the encounter.   EKG Interpretation None       Briefly, pt is a 43 y.o. female presenting with atraumatic foot pain.  I performed an examination on the patient including cardiac, pulmonary, and gi systems which were unremarkable, however there was concern for possible midfoot injury or instability given physical exam demonstrating tenderness for both the plantar and dorsal surfaces of midfoot.  The pt has been a borderline diabetic in the past.  XR standing view obtained to ro lisfranc.  This was unremarkable, and given low pretest probability, do not feel CT warranted.  DC home to fu with orthopedics.     Mirian Mo, MD 04/06/14 [phone]

## 2014-05-13 ENCOUNTER — Encounter (HOSPITAL_COMMUNITY): Payer: Self-pay | Admitting: Emergency Medicine

## 2014-05-15 ENCOUNTER — Emergency Department (HOSPITAL_COMMUNITY)
Admission: EM | Admit: 2014-05-15 | Discharge: 2014-05-16 | Disposition: A | Discharge status: Home / Self Care | Payer: BC Managed Care – PPO | Attending: Emergency Medicine | Admitting: Emergency Medicine

## 2014-05-15 ENCOUNTER — Encounter (HOSPITAL_COMMUNITY): Payer: Self-pay | Admitting: *Deleted

## 2014-05-15 ENCOUNTER — Emergency Department (HOSPITAL_COMMUNITY): Payer: BC Managed Care – PPO

## 2014-05-15 DIAGNOSIS — R221 Localized swelling, mass and lump, neck: Secondary | ICD-10-CM | POA: Diagnosis not present

## 2014-05-15 DIAGNOSIS — F329 Major depressive disorder, single episode, unspecified: Secondary | ICD-10-CM | POA: Insufficient documentation

## 2014-05-15 DIAGNOSIS — Z87891 Personal history of nicotine dependence: Secondary | ICD-10-CM | POA: Diagnosis not present

## 2014-05-15 DIAGNOSIS — M797 Fibromyalgia: Secondary | ICD-10-CM | POA: Insufficient documentation

## 2014-05-15 DIAGNOSIS — Z79899 Other long term (current) drug therapy: Secondary | ICD-10-CM | POA: Insufficient documentation

## 2014-05-15 DIAGNOSIS — K1120 Sialoadenitis, unspecified: Secondary | ICD-10-CM | POA: Diagnosis not present

## 2014-05-15 DIAGNOSIS — Z91040 Latex allergy status: Secondary | ICD-10-CM | POA: Diagnosis not present

## 2014-05-15 DIAGNOSIS — Z87448 Personal history of other diseases of urinary system: Secondary | ICD-10-CM | POA: Insufficient documentation

## 2014-05-15 DIAGNOSIS — Z8719 Personal history of other diseases of the digestive system: Secondary | ICD-10-CM | POA: Insufficient documentation

## 2014-05-15 DIAGNOSIS — Z8673 Personal history of transient ischemic attack (TIA), and cerebral infarction without residual deficits: Secondary | ICD-10-CM | POA: Insufficient documentation

## 2014-05-15 DIAGNOSIS — J029 Acute pharyngitis, unspecified: Secondary | ICD-10-CM | POA: Diagnosis present

## 2014-05-15 DIAGNOSIS — F419 Anxiety disorder, unspecified: Secondary | ICD-10-CM | POA: Insufficient documentation

## 2014-05-15 DIAGNOSIS — R220 Localized swelling, mass and lump, head: Secondary | ICD-10-CM

## 2014-05-15 LAB — I-STAT CHEM 8, ED
BUN: 12 mg/dL (ref 6–23)
CREATININE: 0.9 mg/dL (ref 0.50–1.10)
Calcium, Ion: 1.2 mmol/L (ref 1.12–1.23)
Chloride: 101 mEq/L (ref 96–112)
GLUCOSE: 83 mg/dL (ref 70–99)
HCT: 44 % (ref 36.0–46.0)
HEMOGLOBIN: 15 g/dL (ref 12.0–15.0)
POTASSIUM: 4.4 meq/L (ref 3.7–5.3)
SODIUM: 140 meq/L (ref 137–147)
TCO2: 30 mmol/L (ref 0–100)

## 2014-05-15 LAB — CBC
HEMATOCRIT: 40.4 % (ref 36.0–46.0)
HEMOGLOBIN: 13.1 g/dL (ref 12.0–15.0)
MCH: 29.4 pg (ref 26.0–34.0)
MCHC: 32.4 g/dL (ref 30.0–36.0)
MCV: 90.8 fL (ref 78.0–100.0)
Platelets: 369 10*3/uL (ref 150–400)
RBC: 4.45 MIL/uL (ref 3.87–5.11)
RDW: 12.8 % (ref 11.5–15.5)
WBC: 7.9 10*3/uL (ref 4.0–10.5)

## 2014-05-15 LAB — TSH: TSH: 3.23 u[IU]/mL (ref 0.350–4.500)

## 2014-05-15 MED ORDER — NAPROXEN 500 MG PO TABS: 500.0000 mg | ORAL_TABLET | Freq: Two times a day (BID) | ORAL | Status: DC

## 2014-05-15 MED ORDER — IOHEXOL 350 MG/ML SOLN
75.0000 mL | Freq: Once | INTRAVENOUS | Status: AC | PRN
Start: 2014-05-15 — End: 2014-05-15
  Administered 2014-05-15: 75 mL via INTRAVENOUS

## 2014-05-15 NOTE — ED Notes (Signed)
Patient transported to CT SCAN . 

## 2014-05-15 NOTE — ED Provider Notes (Signed)
CSN: [id number]     Arrival date & time 05/15/14  1827 History   First MD Initiated Contact with Patient 05/15/14 2110     Chief Complaint  Patient presents with  . Oral Swelling  . Sore Throat    (Consider location/radiation/quality/duration/timing/severity/associated sxs/prior Treatment)  HPI Comments: 43 year old female with a history of fibromyalgia, Crohn's disease, stroke with residual left-sided weakness, depression, and anxiety presents to the ED for further evaluation of neck mass. Patient states that she has noticed swelling in her neck 3 months. She states that she noticed the swelling initially when she had poison ivy. She states that her poison ivy resolves, or neck mass persistent. She states that symptoms have been constant and waxing and waning in severity. She denies any modifying factors of symptoms and states that the pain worsens when swelling worsens. She describes the pain as an ache and denies any radiation of the pain. Patient has not taken anything for symptoms. She states that she has not followed up with her primary doctor for recheck of the mass. She states she has been experiencing some night sweats x 3-4 months. She denies associated fever, dental pain, trismus, difficulty speaking or swallowing, ear pain, sore throat, neck stiffness, shortness of breath, skin color change, or trauma/injury. She states her mother has a hx of Hodgkin's Lymphoma; sister has hx of leukemia.  Patient is a 43 y.o. female presenting with pharyngitis. The history is provided by the patient. No language interpreter was used.  Sore Throat Associated symptoms include neck pain (and mass). Pertinent negatives include no fever or sore throat.    Past Medical History  Diagnosis Date  . Fibromyalgia 2000  . Crohn's disease 2002  . Stroke 2012    left sided weakness  . Depression   . Anxiety   . Dyspareunia   . Crohn's disease 12/13/2013   Past Surgical History  Procedure Laterality Date   . Cholecystectomy  1997    laparoscopic  . Hand surgery    . Elbow surgery    . Ankle surgery    . Total abdominal hysterectomy w/ bilateral salpingoophorectomy  2008    recurrent ovarian cysts  . Abdominal hysterectomy     Family History  Problem Relation Age of Onset  . Hodgkin's lymphoma Mother   . Breast cancer Maternal Aunt   . Hypertension Father   . Osteoporosis      Paternal side  . Diabetes type II Father   . Stroke Mother   . Cancer Father    History  Substance Use Topics  . Smoking status: Former Smoker    Quit date: 07/12/2010  . Smokeless tobacco: Never Used  . Alcohol Use: Yes     Comment: rarely   OB History    Gravida Para Term Preterm AB TAB SAB Ectopic Multiple Living   4 4 4       4       Review of Systems  Constitutional: Negative for fever.  HENT: Negative for dental problem, ear pain, sore throat and trouble swallowing.   Respiratory: Negative for shortness of breath.   Musculoskeletal: Positive for neck pain (and mass).  Skin: Negative for color change.  Neurological: Negative for syncope.  All other systems reviewed and are negative.   Allergies  Codeine; Geodon; Wellbutrin; Latex; and Tagamet  Home Medications   Prior to Admission medications   Medication Sig Start Date End Date Taking? Authorizing Provider  ALPRAZolam Prudy Feeler(XANAX) 1 MG tablet Take 0.5 mg by  mouth as needed.  09/20/13  Yes Historical Provider, MD  amphetamine-dextroamphetamine (ADDERALL XR) 20 MG 24 hr capsule Take 40 mg by mouth daily.   Yes Historical Provider, MD  citalopram (CELEXA) 40 MG tablet Take 40 mg by mouth daily.  09/21/13  Yes Historical Provider, MD  cyclobenzaprine (FLEXERIL) 5 MG tablet Take 5 mg by mouth 3 (three) times daily as needed for muscle spasms.   Yes Historical Provider, MD  lidocaine (XYLOCAINE JELLY) 2 % jelly Apply 1 application topically as needed. As needed 11/06/13   Bennye Alm, MD  lidocaine (XYLOCAINE) 5 % ointment Apply 1 application  topically 3 (three) times daily. 09/21/13   Bennye Alm, MD  naproxen (NAPROSYN) 500 MG tablet Take 1 tablet (500 mg total) by mouth 2 (two) times daily. 05/15/14   Antony Madura, PA-C  oxyCODONE-acetaminophen (PERCOCET/ROXICET) 5-325 MG per tablet 1 to 2 tabs PO q6hrs  PRN for pain 04/01/14   Joni Reining Pisciotta, PA-C  promethazine (PHENERGAN) 25 MG tablet Take 1 tablet (25 mg total) by mouth every 6 (six) hours as needed for nausea or vomiting. 04/01/14   Joni Reining Pisciotta, PA-C  SYNTHROID 50 MCG tablet Take 50 mcg by mouth daily before breakfast.  09/21/13   Historical Provider, MD   BP 125/77 mmHg  Pulse 94  Temp(Src) 98.1 F (36.7 C) (Oral)  Resp 15  Ht 5\' 1"  (1.549 m)  Wt 155 lb (70.308 kg)  BMI 29.30 kg/m2  SpO2 100%   Physical Exam  Constitutional: She is oriented to person, place, and time. She appears well-developed and well-nourished. No distress.  Nontoxic/nonseptic appearing  HENT:  Head: Normocephalic and atraumatic.  Mouth/Throat: Oropharynx is clear and moist. No oropharyngeal exudate.  Oropharynx clear. Uvula midline. No tonsillar enlargement or exudates. Patient tolerating secretions without difficulty.  Eyes: Conjunctivae and EOM are normal. No scleral icterus.  Neck: Normal range of motion. Neck supple. No thyromegaly present.  Cardiovascular: Normal rate, regular rhythm and normal heart sounds.   No carotid bruits bilaterally  Pulmonary/Chest: Effort normal and breath sounds normal. No respiratory distress. She has no wheezes. She has no rales.  No tachypnea or dyspnea. Respirations even and unlabored. Chest expansion symmetric.  Musculoskeletal: Normal range of motion.  Lymphadenopathy:    She has cervical adenopathy.       Right cervical: No superficial cervical adenopathy present.      Left cervical: Superficial cervical (TTP) adenopathy present.  Neurological: She is alert and oriented to person, place, and time. She exhibits normal muscle tone. Coordination  normal.  GCS 15. Patient moves extremities without ataxia. Speech is goal oriented.  Skin: Skin is warm and dry. No rash noted. She is not diaphoretic. No erythema. No pallor.  Psychiatric: She has a normal mood and affect. Her behavior is normal.  Nursing note and vitals reviewed.   ED Course  Procedures (including critical care time) Labs Review Labs Reviewed  CBC  TSH  T4, FREE  I-STAT CHEM 8, ED    Imaging Review Ct Soft Tissue Neck W Contrast  05/15/2014   CLINICAL DATA:  Intermittent in the LEFT neck and throat swelling for 3 months, now worsening with dysphagia.  EXAM: CT NECK WITH CONTRAST  TECHNIQUE: Multidetector CT imaging of the neck was performed using the standard protocol following the bolus administration of intravenous contrast.  CONTRAST:  75 cc Omnipaque 300  COMPARISON:  MRI of the head December 25, 2013  FINDINGS: Trace effusion in the LEFT floor of mouth,  contiguous with the LEFT strap muscles and extending to the LEFT submandibular space with trace LEFT submandibular space effusion and overlying thickened at platysma. Minimal bilateral submandibular gland ductal dilatation without sialolith, or mass. Trace effusion extends into the LEFT parapharyngeal fat planes. Normal appearance of the parotid glands.  Normal appearance of the aerodigestive tract. Normal thyroid gland. Normal cervical vessels. No lymphadenopathy by CT size criteria.  Straightened cervical lordosis with moderate C6-7 degenerative disc. Paranasal sinuses are well aerated. Dental caries and periapical abscess noted.  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 Mandible dental caries and periapical lucencies, findings may be odontogenic in origin. Findings less likely reflect cellulitis.   Electronically Signed   By: Awilda Metro   On: 05/15/2014 23:41     EKG Interpretation None      MDM   Final diagnoses:  Head or neck swelling,  mass, or lump  Sialoadenitis of submandibular gland    43 year old female presents to the emergency department for further evaluation of a mass to the left side of her neck. Patient states that symptoms have been waxing and waning over the past 3 months. She states that pain worsens with worsening swelling. Patient is well and nontoxic appearing. Patient is tolerating secretions without difficulty. No voice muffling. Uvula midline. Patient afebrile and hemodynamically stable. No leukocytosis on workup today. TSH normal.  Symptoms further evaluated with CT scan. 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 Upon reporting results to patient, she states that she does experience some increased discomfort and mild worsening in swelling with meals. As her symptoms have been chronic in nature and patient has been free of fever without leukocytosis, do not believe that source is infectious. Dental caries also appreciated on imaging; however, patient has no trismus or dentalgia to suggest dental abscess.  Do not believe further emergent workup is indicated. Patient will be discharged with ENT referral for further evaluation of symptoms. Have advised naproxen, increased fluid hydration, and warm compresses to the area for management. Return precautions provided and patient agreeable to plan with no unaddressed concerns.   Filed Vitals:   05/15/14 1854  BP: 125/77  Pulse: 94  Temp: 98.1 F (36.7 C)  TempSrc: Oral  Resp: 15  Height: 5\' 1"  (1.549 m)  Weight: 155 lb (70.308 kg)  SpO2: 100%     Antony Madura, PA-C 05/16/14 0007  Toy Baker, MD 05/18/14 [phone]

## 2014-05-15 NOTE — Discharge Instructions (Signed)
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 This is sometimes caused by an infectious source; however, I believe this is less likely given duration of your symptoms. Autoimmune reactions may also cause this. Recommend that you follow-up with an ear nose and throat doctor for further evaluation of your symptoms. In the interim, take naproxen as prescribed for pain control. You may try applying warm compresses and increasing your daily water intake. Return as needed if symptoms worsen.

## 2014-05-15 NOTE — ED Notes (Signed)
PA at the bedside.

## 2014-05-15 NOTE — ED Notes (Signed)
Pt reports intermittent swelling to left throat/neck x 3 months. Pt feels like it has increased in size and now becoming more difficult to swallow. Airway intact at triage, spo2 100% and pt speaking in full sentences.

## 2014-05-16 LAB — T4, FREE: Free T4: 0.94 ng/dL (ref 0.80–1.80)

## 2015-10-23 ENCOUNTER — Other Ambulatory Visit: Payer: Self-pay | Admitting: Orthopedic Surgery

## 2015-10-23 DIAGNOSIS — G959 Disease of spinal cord, unspecified: Secondary | ICD-10-CM

## 2015-10-23 DIAGNOSIS — M5416 Radiculopathy, lumbar region: Secondary | ICD-10-CM

## 2015-10-31 ENCOUNTER — Ambulatory Visit
Admission: RE | Admit: 2015-10-31 | Discharge: 2015-10-31 | Disposition: A | Discharge status: Home / Self Care | Source: Ambulatory Visit | Attending: Orthopedic Surgery | Admitting: Orthopedic Surgery

## 2015-10-31 DIAGNOSIS — M5416 Radiculopathy, lumbar region: Secondary | ICD-10-CM

## 2015-10-31 DIAGNOSIS — G959 Disease of spinal cord, unspecified: Secondary | ICD-10-CM

## 2015-11-24 ENCOUNTER — Other Ambulatory Visit: Payer: Self-pay | Admitting: Family Medicine

## 2015-11-24 DIAGNOSIS — G44029 Chronic cluster headache, not intractable: Secondary | ICD-10-CM

## 2015-11-24 DIAGNOSIS — E041 Nontoxic single thyroid nodule: Secondary | ICD-10-CM

## 2015-12-01 ENCOUNTER — Other Ambulatory Visit

## 2015-12-10 ENCOUNTER — Encounter (HOSPITAL_COMMUNITY): Payer: Self-pay

## 2015-12-10 ENCOUNTER — Emergency Department (HOSPITAL_COMMUNITY)

## 2015-12-10 ENCOUNTER — Observation Stay (HOSPITAL_COMMUNITY)

## 2015-12-10 ENCOUNTER — Observation Stay (HOSPITAL_COMMUNITY)
Admission: EM | Admit: 2015-12-10 | Discharge: 2015-12-11 | Disposition: A | Discharge status: Home / Self Care | Attending: Internal Medicine | Admitting: Internal Medicine

## 2015-12-10 DIAGNOSIS — Z87891 Personal history of nicotine dependence: Secondary | ICD-10-CM | POA: Insufficient documentation

## 2015-12-10 DIAGNOSIS — F449 Dissociative and conversion disorder, unspecified: Secondary | ICD-10-CM

## 2015-12-10 DIAGNOSIS — E039 Hypothyroidism, unspecified: Secondary | ICD-10-CM | POA: Diagnosis present

## 2015-12-10 DIAGNOSIS — Z8673 Personal history of transient ischemic attack (TIA), and cerebral infarction without residual deficits: Secondary | ICD-10-CM | POA: Insufficient documentation

## 2015-12-10 DIAGNOSIS — F419 Anxiety disorder, unspecified: Secondary | ICD-10-CM | POA: Diagnosis not present

## 2015-12-10 DIAGNOSIS — F430 Acute stress reaction: Secondary | ICD-10-CM

## 2015-12-10 DIAGNOSIS — F329 Major depressive disorder, single episode, unspecified: Secondary | ICD-10-CM | POA: Diagnosis not present

## 2015-12-10 DIAGNOSIS — K50918 Crohn's disease, unspecified, with other complication: Secondary | ICD-10-CM | POA: Diagnosis not present

## 2015-12-10 DIAGNOSIS — Z79899 Other long term (current) drug therapy: Secondary | ICD-10-CM | POA: Diagnosis not present

## 2015-12-10 DIAGNOSIS — I639 Cerebral infarction, unspecified: Principal | ICD-10-CM

## 2015-12-10 DIAGNOSIS — R531 Weakness: Secondary | ICD-10-CM | POA: Diagnosis present

## 2015-12-10 DIAGNOSIS — K5090 Crohn's disease, unspecified, without complications: Secondary | ICD-10-CM | POA: Diagnosis present

## 2015-12-10 DIAGNOSIS — G8929 Other chronic pain: Secondary | ICD-10-CM | POA: Diagnosis not present

## 2015-12-10 DIAGNOSIS — F32A Depression, unspecified: Secondary | ICD-10-CM | POA: Diagnosis present

## 2015-12-10 HISTORY — DX: Hypothyroidism, unspecified: E03.9

## 2015-12-10 HISTORY — DX: Systemic lupus erythematosus, unspecified: M32.9

## 2015-12-10 LAB — I-STAT CHEM 8, ED
BUN: 9 mg/dL (ref 6–20)
CREATININE: 0.8 mg/dL (ref 0.44–1.00)
Calcium, Ion: 1.23 mmol/L (ref 1.12–1.23)
Chloride: 103 mmol/L (ref 101–111)
GLUCOSE: 89 mg/dL (ref 65–99)
HCT: 43 % (ref 36.0–46.0)
HEMOGLOBIN: 14.6 g/dL (ref 12.0–15.0)
POTASSIUM: 3.8 mmol/L (ref 3.5–5.1)
Sodium: 142 mmol/L (ref 135–145)
TCO2: 25 mmol/L (ref 0–100)

## 2015-12-10 LAB — SEDIMENTATION RATE: Sed Rate: 22 mm/hr (ref 0–22)

## 2015-12-10 LAB — URINALYSIS, ROUTINE W REFLEX MICROSCOPIC
Bilirubin Urine: NEGATIVE
Glucose, UA: NEGATIVE mg/dL
HGB URINE DIPSTICK: NEGATIVE
KETONES UR: NEGATIVE mg/dL
LEUKOCYTES UA: NEGATIVE
Nitrite: NEGATIVE
PROTEIN: NEGATIVE mg/dL
Specific Gravity, Urine: 1.01 (ref 1.005–1.030)
pH: 6 (ref 5.0–8.0)

## 2015-12-10 LAB — CBC
HEMATOCRIT: 41.6 % (ref 36.0–46.0)
HEMOGLOBIN: 13.8 g/dL (ref 12.0–15.0)
MCH: 30.5 pg (ref 26.0–34.0)
MCHC: 33.2 g/dL (ref 30.0–36.0)
MCV: 91.8 fL (ref 78.0–100.0)
Platelets: 346 10*3/uL (ref 150–400)
RBC: 4.53 MIL/uL (ref 3.87–5.11)
RDW: 12.7 % (ref 11.5–15.5)
WBC: 5.3 10*3/uL (ref 4.0–10.5)

## 2015-12-10 LAB — COMPREHENSIVE METABOLIC PANEL
ALT: 12 U/L — AB (ref 14–54)
AST: 18 U/L (ref 15–41)
Albumin: 4.1 g/dL (ref 3.5–5.0)
Alkaline Phosphatase: 89 U/L (ref 38–126)
Anion gap: 8 (ref 5–15)
BILIRUBIN TOTAL: 0.6 mg/dL (ref 0.3–1.2)
BUN: 10 mg/dL (ref 6–20)
CO2: 26 mmol/L (ref 22–32)
CREATININE: 0.76 mg/dL (ref 0.44–1.00)
Calcium: 9.2 mg/dL (ref 8.9–10.3)
Chloride: 104 mmol/L (ref 101–111)
GFR calc Af Amer: >60 mL/min (ref >60)
Glucose, Bld: 94 mg/dL (ref 65–99)
Potassium: 3.8 mmol/L (ref 3.5–5.1)
Sodium: 138 mmol/L (ref 135–145)
TOTAL PROTEIN: 7.5 g/dL (ref 6.5–8.1)

## 2015-12-10 LAB — DIFFERENTIAL
BASOS ABS: 0 10*3/uL (ref 0.0–0.1)
Basophils Relative: 1 %
Eosinophils Absolute: 0.2 10*3/uL (ref 0.0–0.7)
Eosinophils Relative: 4 %
LYMPHS ABS: 2.3 10*3/uL (ref 0.7–4.0)
LYMPHS PCT: 43 %
MONOS PCT: 8 %
Monocytes Absolute: 0.4 10*3/uL (ref 0.1–1.0)
NEUTROS ABS: 2.3 10*3/uL (ref 1.7–7.7)
Neutrophils Relative %: 44 %

## 2015-12-10 LAB — CBG MONITORING, ED: Glucose-Capillary: 84 mg/dL (ref 65–99)

## 2015-12-10 LAB — APTT: APTT: 28 s (ref 24–37)

## 2015-12-10 LAB — RAPID URINE DRUG SCREEN, HOSP PERFORMED
Amphetamines: POSITIVE — AB
BARBITURATES: NOT DETECTED
BENZODIAZEPINES: NOT DETECTED
Cocaine: NOT DETECTED
Opiates: NOT DETECTED
TETRAHYDROCANNABINOL: NOT DETECTED

## 2015-12-10 LAB — I-STAT TROPONIN, ED: TROPONIN I, POC: 0 ng/mL (ref 0.00–0.08)

## 2015-12-10 LAB — PROTIME-INR
INR: 0.99 (ref 0.00–1.49)
Prothrombin Time: 13.3 seconds (ref 11.6–15.2)

## 2015-12-10 LAB — ETHANOL: Alcohol, Ethyl (B): <5 mg/dL (ref <5)

## 2015-12-10 MED ORDER — OXYCODONE-ACETAMINOPHEN 5-325 MG PO TABS: 1.0000 | ORAL_TABLET | Freq: Four times a day (QID) | ORAL | Status: DC | PRN

## 2015-12-10 MED ORDER — SENNOSIDES-DOCUSATE SODIUM 8.6-50 MG PO TABS: 1.0000 | ORAL_TABLET | Freq: Every evening | ORAL | Status: DC | PRN

## 2015-12-10 MED ORDER — NAPROXEN 250 MG PO TABS
500.0000 mg | ORAL_TABLET | Freq: Two times a day (BID) | ORAL | Status: DC
  Administered 2015-12-10 – 2015-12-11 (×2): 500 mg via ORAL
  Filled 2015-12-10 (×2): qty 2

## 2015-12-10 MED ORDER — PROMETHAZINE HCL 12.5 MG PO TABS
25.0000 mg | ORAL_TABLET | Freq: Four times a day (QID) | ORAL | Status: DC | PRN
  Administered 2015-12-11: 25 mg via ORAL
  Filled 2015-12-10: qty 2

## 2015-12-10 MED ORDER — CITALOPRAM HYDROBROMIDE 20 MG PO TABS
40.0000 mg | ORAL_TABLET | Freq: Every day | ORAL | Status: DC
  Filled 2015-12-10: qty 2

## 2015-12-10 MED ORDER — IBUPROFEN 800 MG PO TABS
800.0000 mg | ORAL_TABLET | Freq: Once | ORAL | Status: AC
  Administered 2015-12-10: 800 mg via ORAL
  Filled 2015-12-10: qty 1

## 2015-12-10 MED ORDER — ASPIRIN 325 MG PO TABS
325.0000 mg | ORAL_TABLET | Freq: Every day | ORAL | Status: DC
  Administered 2015-12-11: 325 mg via ORAL
  Filled 2015-12-10: qty 1

## 2015-12-10 MED ORDER — ENOXAPARIN SODIUM 40 MG/0.4ML ~~LOC~~ SOLN
SUBCUTANEOUS | Status: AC
  Filled 2015-12-10: qty 0.4

## 2015-12-10 MED ORDER — AMPHETAMINE-DEXTROAMPHET ER 20 MG PO CP24: 40.0000 mg | ORAL_CAPSULE | Freq: Every day | ORAL | Status: DC

## 2015-12-10 MED ORDER — LEVOTHYROXINE SODIUM 50 MCG PO TABS
50.0000 ug | ORAL_TABLET | Freq: Every day | ORAL | Status: DC
  Administered 2015-12-11: 50 ug via ORAL
  Filled 2015-12-10: qty 1

## 2015-12-10 MED ORDER — CYCLOBENZAPRINE HCL 10 MG PO TABS: 5.0000 mg | ORAL_TABLET | Freq: Three times a day (TID) | ORAL | Status: DC | PRN

## 2015-12-10 MED ORDER — ASPIRIN 300 MG RE SUPP: 300.0000 mg | Freq: Every day | RECTAL | Status: DC

## 2015-12-10 MED ORDER — ENOXAPARIN SODIUM 40 MG/0.4ML ~~LOC~~ SOLN
40.0000 mg | SUBCUTANEOUS | Status: DC
  Administered 2015-12-10 – 2015-12-11 (×2): 40 mg via SUBCUTANEOUS
  Filled 2015-12-10: qty 0.4

## 2015-12-10 MED ORDER — IOPAMIDOL (ISOVUE-370) INJECTION 76%
75.0000 mL | Freq: Once | INTRAVENOUS | Status: AC | PRN
  Administered 2015-12-10: 75 mL via INTRAVENOUS

## 2015-12-10 MED ORDER — LORAZEPAM 2 MG/ML IJ SOLN
1.0000 mg | INTRAMUSCULAR | Status: AC | PRN
  Administered 2015-12-10 (×2): 1 mg via INTRAVENOUS
  Filled 2015-12-10 (×2): qty 1

## 2015-12-10 MED ORDER — OXYCODONE-ACETAMINOPHEN 5-325 MG PO TABS
2.0000 | ORAL_TABLET | Freq: Once | ORAL | Status: AC
  Administered 2015-12-10: 2 via ORAL
  Filled 2015-12-10: qty 2

## 2015-12-10 MED ORDER — ASPIRIN 325 MG PO TABS
ORAL_TABLET | ORAL | Status: AC
  Filled 2015-12-10: qty 1

## 2015-12-10 MED ORDER — LORAZEPAM 1 MG PO TABS: 1.0000 mg | ORAL_TABLET | ORAL | Status: DC | PRN
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
## 2015-12-10 NOTE — ED Notes (Signed)
MRI notified that pt can be taken to Rm [phone] after MRI is finished. Report has been called to [place], Charity fundraiser on 300.

## 2015-12-10 NOTE — Progress Notes (Signed)
1220 call and beeper 1230 pt on table 1234 completed sent to soc and called gr

## 2015-12-10 NOTE — ED Provider Notes (Addendum)
CSN: [id number]     Arrival date & time 12/10/15  1205 History  By signing my name below, I, Bridgette Habermann, attest that this documentation has been prepared under the direction and in the presence of Rolland Porter, MD. Electronically Signed: Bridgette Habermann, ED Scribe. 12/10/2015. 12:57 PM.   Chief Complaint  Patient presents with  . Stroke Symptoms   LEVEL 5 CAVEAT DUE TO ALTERED MENTAL STATUS  The history is provided by the patient and a relative. No language interpreter was used.    HPI Comments: Nichole Aguilar is a 45 y.o. female who presents to the Emergency Department for AMS. Patient repeatedly states she "feels funny". Per pt's son, they were at home, arguing at about 11:50 am when she suddenly closed her eyes and held her head.  Patient's son reports that her words were slurred. He asked her to squeeze his fingers and pick up her arms and she was not able to. Pt's son carried her to the car and drove her to the ED. Per pt's son, she is on prescription Xanax and Adderall. Son reports h/o CVA with no residual deficits.   Past Medical History  Diagnosis Date  . Fibromyalgia 2000  . Crohn's disease (HCC) 2002  . Stroke [facility]) 2012    left sided weakness  . Depression   . Anxiety   . Dyspareunia   . Crohn's disease (HCC) 12/13/2013  . Lupus [facility])    Past Surgical History  Procedure Laterality Date  . Cholecystectomy  1997    laparoscopic  . Hand surgery    . Elbow surgery    . Ankle surgery    . Total abdominal hysterectomy w/ bilateral salpingoophorectomy  2008    recurrent ovarian cysts  . Abdominal hysterectomy     Family History  Problem Relation Age of Onset  . Hodgkin's lymphoma Mother   . Breast cancer Maternal Aunt   . Hypertension Father   . Osteoporosis      Paternal side  . Diabetes type II Father   . Stroke Mother   . Cancer Father    Social History  Substance Use Topics  . Smoking status: Former Smoker    Quit date: 07/12/2010  . Smokeless tobacco: Never Used  .  Alcohol Use: No   OB History    Gravida Para Term Preterm AB TAB SAB Ectopic Multiple Living   Review of Systems  Unable to perform ROS: Mental status change     Allergies  Codeine; Geodon; Wellbutrin; Latex; and Tagamet  Home Medications   Prior to Admission medications   Medication Sig Start Date End Date Taking? Authorizing Provider  ALPRAZolam Prudy Feeler) 1 MG tablet Take 0.5 mg by mouth as needed.  09/20/13   Historical Provider, MD  amphetamine-dextroamphetamine (ADDERALL XR) 20 MG 24 hr capsule Take 40 mg by mouth daily.    Historical Provider, MD  citalopram (CELEXA) 40 MG tablet Take 40 mg by mouth daily.  09/21/13   Historical Provider, MD  cyclobenzaprine (FLEXERIL) 5 MG tablet Take 5 mg by mouth 3 (three) times daily as needed for muscle spasms.    Historical Provider, MD  lidocaine (XYLOCAINE JELLY) 2 % jelly Apply 1 application topically as needed. As needed 11/06/13   Douglass Rivers, MD  lidocaine (XYLOCAINE) 5 % ointment Apply 1 application topically 3 (three) times daily. 09/21/13   Douglass Rivers, MD  naproxen (NAPROSYN) 500 MG tablet  Take 1 tablet (500 mg total) by mouth 2 (two) times daily. 05/15/14   Antony Madura, PA-C  oxyCODONE-acetaminophen (PERCOCET/ROXICET) 5-325 MG per tablet 1 to 2 tabs PO q6hrs  PRN for pain 04/01/14   Joni Reining Pisciotta, PA-C  promethazine (PHENERGAN) 25 MG tablet Take 1 tablet (25 mg total) by mouth every 6 (six) hours as needed for nausea or vomiting. 04/01/14   Joni Reining Pisciotta, PA-C  SYNTHROID 50 MCG tablet Take 50 mcg by mouth daily before breakfast.  09/21/13   Historical Provider, MD   BP 124/82 mmHg  Pulse 88  Resp 19  SpO2 100% Physical Exam  Constitutional: She is oriented to person, place, and time. She appears well-developed and well-nourished. No distress.  Eyes open poor eye contact. Slow, slurred speech.   HENT:  Head: Normocephalic and atraumatic.  Eyes: Conjunctivae are normal. Pupils are equal, round, and  reactive to light. No scleral icterus.  Neck: Normal range of motion. Neck supple. No thyromegaly present.  Cardiovascular: Normal rate and regular rhythm.  Exam reveals no gallop and no friction rub.   No murmur heard. Pulmonary/Chest: Effort normal and breath sounds normal. No respiratory distress. She has no wheezes. She has no rales.  Abdominal: Soft. Bowel sounds are normal. She exhibits no distension. There is no tenderness. There is no rebound.  Musculoskeletal: Normal range of motion.  Neurological: She is alert and oriented to person, place, and time.  No facial asymmetry, weakness in all four extremities. Right > left with little effort. "Give away" strength.   Skin: Skin is warm and dry. No rash noted.  Psychiatric:  Flat affect, generalized indifference   Nursing note and vitals reviewed.   ED Course  Procedures (including critical care time) DIAGNOSTIC STUDIES: Oxygen Saturation is 97% on RA, normal by my interpretation.    Labs Review Labs Reviewed  COMPREHENSIVE METABOLIC PANEL - Abnormal; Notable for the following:    ALT 12 (*)    All other components within normal limits  URINE RAPID DRUG SCREEN, HOSP PERFORMED - Abnormal; Notable for the following:    Amphetamines POSITIVE (*)    All other components within normal limits  ETHANOL  PROTIME-INR  APTT  CBC  DIFFERENTIAL  URINALYSIS, ROUTINE W REFLEX MICROSCOPIC (NOT AT [facility])  CBG MONITORING, ED  I-STAT CHEM 8, ED  I-STAT TROPOININ, ED    Imaging Review Ct Angio Head W/cm &/or Wo Cm  12/10/2015  CLINICAL DATA:  CVA.  Sudden onset dysphasia at at 11:50 a.m. today. EXAM: CT ANGIOGRAPHY HEAD TECHNIQUE: Multidetector CT imaging of the head was performed using the standard protocol during bolus administration of intravenous contrast. Multiplanar CT image reconstructions and MIPs were obtained to evaluate the vascular anatomy. CONTRAST:  75 mL Isovue 370 COMPARISON:  Noncontrast head CT from the same day. FINDINGS:  CTA HEAD Anterior circulation: The internal carotid arteries are within normal limits from the high cervical segments through the ICA termini bilaterally. The A1 and M1 segments are normal. The MCA bifurcations are intact. MCA and ACA branch vessels are within normal limits. There is moderate distal segmental irregularity throughout the branch vessels. No significant proximal stenosis or occlusion is present. Posterior circulation: The left vertebral artery is slightly dominant to the right. PICA origins are visualized and normal. The basilar artery is intact. The left posterior cerebral artery originates from the basilar tip. The right posterior cerebral artery is of fetal type. A small right P1 segment and small left posterior communicating artery is present. There  is segmental narrowing of distal PCA branch vessels without a significant proximal stenosis or occlusion. Venous sinuses: The dural sinuses are patent. The right transverse sinus is dominant. The straight sinus and deep cerebral veins are intact. Cortical veins are unremarkable. Anatomic variants: Fetal type right posterior cerebral artery. Delayed phase:The postcontrast images demonstrate no pathologic enhancement. Gray-white differentiation is evident throughout the cortex. Basal ganglia are intact. The insular ribbon is normal. IMPRESSION: 1. No significant proximal stenosis, aneurysm, or branch vessel occlusion. 2. No acute infarct. 3. Mild diffuse distal small vessel disease is present throughout. Electronically Signed   By: Marin Roberts M.D.   On: 12/10/2015 15:00   Ct Head Wo Contrast  12/10/2015  CLINICAL DATA:  Sudden onset of slurred speech EXAM: CT HEAD WITHOUT CONTRAST TECHNIQUE: Contiguous axial images were obtained from the base of the skull through the vertex without intravenous contrast. COMPARISON:  None. FINDINGS: Bony calvarium is intact. No gross soft tissue abnormality is noted. Paranasal sinuses are within normal limits.  No findings to suggest acute hemorrhage, acute infarction or space-occupying mass lesion are noted. IMPRESSION: No acute intracranial abnormality noted. Critical Value/emergent results were called by telephone at the time of interpretation on 12/10/2015 at 12:38 pm to Dr. Rolland Porter , who verbally acknowledged these results. Electronically Signed   By: Alcide Clever M.D.   On: 12/10/2015 12:43   I have personally reviewed and evaluated these images and lab results as part of my medical decision-making.   EKG Interpretation None      MDM   Final diagnoses:  CVA (cerebral infarction)  Conversion reaction  Weakness  Emotional crisis, acute reaction to stress   On the multiple re-evaluations the patient shows slow improvement. Initially after my primary evaluation a code stroke was ordered. Patient was evaluated by [facility] neurology. CT is normal. CT angiogram is normal. I was present during a discussion between the patella neurologist and the patient and all family members regarding use of TPA. Family is making an informed decision with the patient that they do not want her to receive TPA. I agree with this decision. Her symptoms do not anatomically fit with a known stroke pattern. I think most likely her diagnosis will be conversion reaction. Given her Ativan. She developed a mild headache and was given ibuprofen. Over multiple reevaluation she has no developed almost full use of left arm and leg. Her speech is fluent. Her right arm and leg still show continued weakness. She initially declined admission the hospital however was unable to use the leg to ambulate. Encouraged her to stay. I discussed the case with Dr. Antionette Char.  The patient will be admitted.  CRITICAL CARE Performed by: Rolland Porter JOSEPH   Total critical care time: 60 minutes  Critical care time was exclusive of separately billable procedures and treating other patients.  Critical care was necessary to treat or prevent imminent or  life-threatening deterioration.  Critical care was time spent personally by me on the following activities: development of treatment plan with patient and/or surrogate as well as nursing, discussions with consultants, evaluation of patient's response to treatment, examination of patient, obtaining history from patient or surrogate, ordering and performing treatments and interventions, ordering and review of laboratory studies, ordering and review of radiographic studies, pulse oximetry and re-evaluation of patient's condition.   Rolland Porter, MD 12/10/15 1719  Rolland Porter, MD 12/10/15 [phone]

## 2015-12-10 NOTE — ED Notes (Signed)
Pt's children report that they were sitting at home having a conversation and around 1150 pt grabbed her head then suddenly was unable to talk and c/o "feeling funny."  Pt's speech slurred.  Daughter says that pt had gotten upset about something prior to symptoms starting.  Daughter says pt has history of stroke.

## 2015-12-11 ENCOUNTER — Observation Stay (HOSPITAL_COMMUNITY)

## 2015-12-11 ENCOUNTER — Observation Stay (HOSPITAL_BASED_OUTPATIENT_CLINIC_OR_DEPARTMENT_OTHER)

## 2015-12-11 DIAGNOSIS — G459 Transient cerebral ischemic attack, unspecified: Secondary | ICD-10-CM | POA: Diagnosis not present

## 2015-12-11 DIAGNOSIS — M6289 Other specified disorders of muscle: Secondary | ICD-10-CM

## 2015-12-11 DIAGNOSIS — E038 Other specified hypothyroidism: Secondary | ICD-10-CM

## 2015-12-11 LAB — LIPID PANEL
CHOL/HDL RATIO: 5 ratio
Cholesterol: 229 mg/dL — ABNORMAL HIGH (ref 0–200)
HDL: 46 mg/dL (ref >40)
LDL CALC: 166 mg/dL — AB (ref 0–99)
Triglycerides: 85 mg/dL (ref <150)
VLDL: 17 mg/dL (ref 0–40)

## 2015-12-11 LAB — ECHOCARDIOGRAM COMPLETE
Height: 61 in
WEIGHTICAEL: 2539.7 [oz_av]

## 2015-12-11 NOTE — Care Management Note (Signed)
Case Management Note  Patient Details  Name: Shenan Cancilla MRN: [id number] Date of Birth: 1971-06-16  Subjective/Objective: Spoke with patient and husband at the bedside.  Patient is lying bed with service dog. Husband is retired Hotel manager. Patient has [employer]. Denies difficulty filling medications. No DME at home and pt stated that she ambulates without difficulty.  Patient recommended for HH vs CIR by physical therapist. Patient stated that she did not want inpatient(CIR) therapy but that [facility] would be OK .  Offered choice of providers and patient  Expressed no preference of providers. Patient and spouse agreed upon [facility] due to [facility] affiliation.  Referral placed with Therisa Doyne at [facility].               Action/Plan: Home with HH PT OT   Expected Discharge Date:                  Expected Discharge Plan:  Home w Home Health Services  In-House Referral:     Discharge planning Services  CM Consult  Post Acute Care Choice:    Choice offered to:  Patient, Spouse  DME Arranged:    DME Agency:     HH Arranged:    HH Agency:  [facility]  Status of Service:  Completed, signed off  Medicare Important Message Given:    Date Medicare IM Given:    Medicare IM give by:    Date Additional Medicare IM Given:    Additional Medicare Important Message give by:     If discussed at Long Length of Stay Meetings, dates discussed:    Additional Comments:  Adonis Huguenin, RN 12/11/2015, 10:59 AM

## 2015-12-11 NOTE — Progress Notes (Signed)
*  PRELIMINARY RESULTS* Echocardiogram 2D Echocardiogram has been performed.  Nichole Aguilar 12/11/2015, 3:41 PM

## 2015-12-11 NOTE — Progress Notes (Signed)
Pt IV and telemetry removed, tolerated well. 

## 2015-12-11 NOTE — Discharge Summary (Signed)
Physician Discharge Summary  Nichole Aguilar ZOX:[id number] DOB: 1971-01-15 DOA: 12/10/2015  PCP: No PCP Per Patient  Admit date: 12/10/2015 Discharge date: 12/11/2015  Time spent: 45 minutes  Recommendations for Outpatient Follow-up:  -We'll be discharged home today. -Advised to follow-up with primary care provider in 2 weeks.   Discharge Diagnoses:  Principal Problem:   Weakness Active Problems:   Depression   Crohn's disease (HCC)   Anxiety   Hypothyroidism   Chronic pain   Weakness of right side of body   Discharge Condition: Stable and improved  Filed Weights   12/10/15 1945  Weight: 72 kg (158 lb 11.7 oz)    History of present illness:  As per Dr. Antionette Char 5/31: Nichole Aguilar is a 45 y.o. female with medical history significant for depression, anxiety, PTSD, Crohn's disease, hypothyroidism, and chronic pain who presents to the ED with acute weakness and slurred speech. Patient reportedly woke in her usual state of health and had an uneventful day until approximately 11:50 AM, when after having a heated argument with her 77 year old son, she laid her head in her hands, had some garbled speech, and was unable to move. This 60 year old son reportedly lifted his limp mother, carrying her to the car, and transporting her to the emergency department. Patient denies any preceding or associated chest pain, palpitations, dizziness, loss of coordination, change in vision, change in hearing, or focal paresthesia. She has never experienced similar symptoms previously. There has been no recent head injury or trauma and there was no loss of consciousness. Patient denies any recent fevers, chills, dyspnea, abdominal pain, nausea, vomiting, or diarrhea.  ED Course: Upon arrival to the ED, patient is found to be saturating well on room air and with vital signs stable. EKG features a sinus rhythm and head CT was negative for acute intracranial abnormality. Chemistry panel is unremarkable, as is CBC.  Troponin is undetectable and urinalysis is grossly negative. Ethanol level returns < 5 and UDS is positive for amphetamines. A code stroke was activated from the emergency department and patient was evaluated by neurology. CT angiogram of the head was obtained and negative for any significant proximal stenosis or acute infarction. Mild diffuse distal small vessel disease was noted throughout on the CTA. Patient remained hemodynamically stable in the emergency department with no arrhythmias on the monitor. Her speech normalized spontaneously while still in the ED and her weakness began to localize to the right upper and lower extremities. Observation on the telemetry unit was advised for ongoing evaluation and management of possible TIA/CVA.  Hospital Course:   Right-sided weakness, mostly resolved. -MRI negative for acute CVA. -Echo: Mild LVH with LVEF approximately 55%. Normal diastolic function.  Trivial tricuspid regurgitation. No obvious PFO or ASD -Carotid Dopplers: No hemodynamically significant stenosis or plaque is seen in either cervical carotid artery -The certainly does not represent a CVA, I do not believe it is a TIA either as she still has some residual symptoms over 24 hours since onset of event. -I wonder if this may represent something like a complex migraine versus potentially some functional weakness or malingering for unknown gain. -I do note in her history that she has a history of chronic pain although I cannot say for certain that she is using her illness for secondary gain.  Rest of chronic conditions have been stable and her home medications have not been changed.  Procedures:  As above   Consultations: None   Discharge Instructions  Discharge Instructions  Increase activity slowly    Complete by:  As directed             Medication List    TAKE these medications        ALPRAZolam 1 MG tablet  Commonly known as:  XANAX  Take 0.5-1 mg by mouth daily as  needed for anxiety.     amphetamine-dextroamphetamine 20 MG 24 hr capsule  Commonly known as:  ADDERALL XR  Take 40 mg by mouth daily.     citalopram 40 MG tablet  Commonly known as:  CELEXA  Take 40 mg by mouth daily.     SYNTHROID 50 MCG tablet  Generic drug:  levothyroxine  Take 50 mcg by mouth daily before breakfast.       Allergies  Allergen Reactions  . Codeine Nausea And Vomiting  . Geodon [Ziprasidone Hcl] Nausea And Vomiting  . Wellbutrin [Bupropion] Other (See Comments)    Made patient crawl out of skin  . Dilaudid [Hydromorphone Hcl] Nausea And Vomiting and Other (See Comments)    Pt reports it causes her to feel like she's "crawling out of her skin".  . Hydrocodone Nausea And Vomiting  . Latex Hives  . Tagamet [Cimetidine] Other (See Comments)    unknown       Follow-up Information    Schedule an appointment as soon as possible for a visit in 2 weeks to follow up.   Why:  With your regular physician       The results of significant diagnostics from this hospitalization (including imaging, microbiology, ancillary and laboratory) are listed below for reference.    Significant Diagnostic Studies: Ct Angio Head W/cm &/or Wo Cm  12/10/2015  CLINICAL DATA:  CVA.  Sudden onset dysphasia at at 11:50 a.m. today. EXAM: CT ANGIOGRAPHY HEAD TECHNIQUE: Multidetector CT imaging of the head was performed using the standard protocol during bolus administration of intravenous contrast. Multiplanar CT image reconstructions and MIPs were obtained to evaluate the vascular anatomy. CONTRAST:  75 mL Isovue 370 COMPARISON:  Noncontrast head CT from the same day. FINDINGS: CTA HEAD Anterior circulation: The internal carotid arteries are within normal limits from the high cervical segments through the ICA termini bilaterally. The A1 and M1 segments are normal. The MCA bifurcations are intact. MCA and ACA branch vessels are within normal limits. There is moderate distal segmental  irregularity throughout the branch vessels. No significant proximal stenosis or occlusion is present. Posterior circulation: The left vertebral artery is slightly dominant to the right. PICA origins are visualized and normal. The basilar artery is intact. The left posterior cerebral artery originates from the basilar tip. The right posterior cerebral artery is of fetal type. A small right P1 segment and small left posterior communicating artery is present. There is segmental narrowing of distal PCA branch vessels without a significant proximal stenosis or occlusion. Venous sinuses: The dural sinuses are patent. The right transverse sinus is dominant. The straight sinus and deep cerebral veins are intact. Cortical veins are unremarkable. Anatomic variants: Fetal type right posterior cerebral artery. Delayed phase:The postcontrast images demonstrate no pathologic enhancement. Gray-white differentiation is evident throughout the cortex. Basal ganglia are intact. The insular ribbon is normal. IMPRESSION: 1. No significant proximal stenosis, aneurysm, or branch vessel occlusion. 2. No acute infarct. 3. Mild diffuse distal small vessel disease is present throughout. Electronically Signed   By: Marin Roberts M.D.   On: 12/10/2015 15:00   Ct Head Wo Contrast  12/10/2015  CLINICAL DATA:  Sudden onset of slurred speech EXAM: CT HEAD WITHOUT CONTRAST TECHNIQUE: Contiguous axial images were obtained from the base of the skull through the vertex without intravenous contrast. COMPARISON:  None. FINDINGS: Bony calvarium is intact. No gross soft tissue abnormality is noted. Paranasal sinuses are within normal limits. No findings to suggest acute hemorrhage, acute infarction or space-occupying mass lesion are noted. IMPRESSION: No acute intracranial abnormality noted. Critical Value/emergent results were called by telephone at the time of interpretation on 12/10/2015 at 12:38 pm to Dr. Rolland Porter , who verbally acknowledged  these results. Electronically Signed   By: Alcide Clever M.D.   On: 12/10/2015 12:43   Mr Brain Wo Contrast  12/10/2015  CLINICAL DATA:  New onset right-sided weakness and slurred speech. Personal history of stroke. EXAM: MRI HEAD WITHOUT CONTRAST TECHNIQUE: Multiplanar, multiecho pulse sequences of the brain and surrounding structures were obtained without intravenous contrast. COMPARISON:  CTA head and neck from the same day. MRI brain 12/25/2012 and 02/23/2013. FINDINGS: No acute infarct, hemorrhage, or mass lesion is present. The ventricles are of normal size. No significant extraaxial fluid collection is present. The study is mildly degraded by patient motion. 3 or 4 scattered subcortical T2 hyperintensities are stable, within normal limits for age. The internal auditory canals are within normal limits bilaterally. The brainstem and cerebellum are normal. Flow is present in the major intracranial arteries. The globes and orbits are intact. The paranasal sinuses and the mastoid air cells are clear. The skullbase is within normal limits. Midline sagittal images are unremarkable. IMPRESSION: Normal MRI of the brain for age. No acute or focal lesion to explain right-sided weakness or slurred speech. Electronically Signed   By: Marin Roberts M.D.   On: 12/10/2015 19:40   US Carotid Bilateral  12/11/2015  CLINICAL DATA:  Acute right-sided body weakness. EXAM: BILATERAL CAROTID DUPLEX ULTRASOUND TECHNIQUE: Wallace Cullens scale imaging, color Doppler and duplex ultrasound were performed of bilateral carotid and vertebral arteries in the neck. COMPARISON:  None. FINDINGS: Criteria: Quantification of carotid stenosis is based on velocity parameters that correlate the residual internal carotid diameter with NASCET-based stenosis levels, using the diameter of the distal internal carotid lumen as the denominator for stenosis measurement. The following velocity measurements were obtained: RIGHT ICA:  75/25 cm/sec CCA:   113/26 cm/sec SYSTOLIC ICA/CCA RATIO:  0.7 DIASTOLIC ICA/CCA RATIO:  1.0 ECA:  69 cm/sec LEFT ICA:  85/33 cm/sec CCA:  86/24 cm/sec SYSTOLIC ICA/CCA RATIO:  1.0 DIASTOLIC ICA/CCA RATIO:  1.4 ECA:  71 cm/sec RIGHT CAROTID ARTERY: No significant plaque or stenosis is noted. RIGHT VERTEBRAL ARTERY:  Antegrade flow is noted. LEFT CAROTID ARTERY:  No significant plaque or stenosis is noted. LEFT VERTEBRAL ARTERY:  Antegrade flow is noted. IMPRESSION: No hemodynamically significant stenosis or plaque is seen in either cervical carotid artery. Electronically Signed   By: Lupita Raider, M.D.   On: 12/11/2015 08:53   Mr Maxine Glenn Head/brain Wo Cm  12/10/2015  CLINICAL DATA:  Right-sided weakness and slurred speech. EXAM: MRA HEAD WITHOUT CONTRAST TECHNIQUE: Angiographic images of the Circle of Willis were obtained using MRA technique without intravenous contrast. COMPARISON:  MRI brain from the same day.  CTA head and neck. FINDINGS: Internal carotid arteries demonstrate moderate tortuosity in the high cervical segments without focal stenosis. There are otherwise within normal limits through the ICA termini bilaterally. The A1 and M1 segments are normal. The anterior communicating artery is patent. The MCA bifurcations are intact bilaterally. There is mild attenuation of  distal MCA branch vessels bilaterally. The vertebral arteries are codominant. The PICA origins are visualized and normal. The basilar artery is within normal limits. The left posterior cerebral artery originates from the basilar tip. The right posterior cerebral artery is of fetal type. A small right P1 segment is intact. IMPRESSION: 1. Mild distal small vessel disease is again seen. 2. No significant proximal stenosis, aneurysm, or branch vessel occlusion. 3. Tortuosity of the high cervical internal carotid arteries is most commonly seen in the setting of chronic hypertension. Electronically Signed   By: Marin Roberts M.D.   On: 12/10/2015 19:42     Microbiology: No results found for this or any previous visit (from the past 240 hour(s)).   Labs: Basic Metabolic Panel:  Recent Labs Lab 12/10/15 1215 12/10/15 1249  NA 138 142  K 3.8 3.8  CL 104 103  CO2 26  --   GLUCOSE 94 89  BUN 10 9  CREATININE 0.76 0.80  CALCIUM 9.2  --    Liver Function Tests:  Recent Labs Lab 12/10/15 1215  AST 18  ALT 12*  ALKPHOS 89  BILITOT 0.6  PROT 7.5  ALBUMIN 4.1   No results for input(s): LIPASE, AMYLASE in the last 168 hours. No results for input(s): AMMONIA in the last 168 hours. CBC:  Recent Labs Lab 12/10/15 1215 12/10/15 1249  WBC 5.3  --   NEUTROABS 2.3  --   HGB 13.8 14.6  HCT 41.6 43.0  MCV 91.8  --   PLT 346  --    Cardiac Enzymes: No results for input(s): CKTOTAL, CKMB, CKMBINDEX, TROPONINI in the last 168 hours. BNP: BNP (last 3 results) No results for input(s): BNP in the last 8760 hours.  ProBNP (last 3 results) No results for input(s): PROBNP in the last 8760 hours.  CBG:  Recent Labs Lab 12/10/15 1212  GLUCAP 84       Signed:  HERNANDEZ ACOSTA,ESTELA  [facility] Pager: [phone] 12/11/2015, 6:23 PM

## 2015-12-11 NOTE — Progress Notes (Signed)
Reviewed discharge instructions with pt and family.  Answered all questions at this time.  Pt will discharge home with family.

## 2015-12-11 NOTE — Evaluation (Signed)
Physical Therapy Evaluation Patient Details Name: Nichole Aguilar MRN: [id number] DOB: 1971-04-03 Today's Date: 12/11/2015   History of Present Illness  Sallye Warr is a 45 y.o. female with medical history significant for depression, anxiety, PTSD, Crohn's disease, hypothyroidism, and chronic pain who presents to the ED with acute weakness and slurred speech. Patient reportedly woke in her usual state of health and had an uneventful day until approximately 11:50 AM, when after having a heated argument with her 33 year old son, she laid her head in her hands, had some garbled speech, and was unable to move. This 34 year old son reportedly lifted his limp mother, carrying her to the car, and transporting her to the emergency department. Patient denies any preceding or associated chest pain, palpitations, dizziness, loss of coordination, change in vision, change in hearing, or focal paresthesia. She has never experienced similar symptoms previously.  Clinical Impression  Patietn found asleep with service dog by her side in bed, easily awoken and willing to participate in skilled PT today, although reports quite a bit of fatigue. Able to perform bed mobility with mod(I) today, however did need min guard for sit to stand and gait due to weakness and unsteadiness; throughout gait noted reduced weight bearing on R, tendency to toe walk especially on R, and some tremor- possibly due to weakness- on R during gait, gait tolerance limited by fatigued today. Did note impairment with RAM finger to chin test, impaired proprioception R side (patient not able to correctly identify position of arm in space with eyes closed), and impaired graphesthesia R as well. DPT feels that she may be appropriate to receive treatment from CIR however patient does not appear very intersted in this at this time, stating "she just wants to go home"; recommend possible CIR if patient becomes agreeable however at minimum patient will need HHPT with  progression to OP PT. Left patient supine in bed with HOB elevated and family in the room, all needs otherwise met; educated nurse regarding patient mobility status, possible attempt to get CIR, and patient request for medicine for feeling "sick" at end of session.     Follow Up Recommendations CIR;Home health PT;Other (comment) (CIR preferable if available and patient agreeable; otherwise HHPT with progression to OP PT )    Equipment Recommendations  Standard walker    Recommendations for Other Services       Precautions / Restrictions Precautions Precautions: Fall Restrictions Weight Bearing Restrictions: No      Mobility  Bed Mobility Overal bed mobility: Modified Independent                Transfers Overall transfer level: Needs assistance Equipment used: Rolling walker (2 wheeled) Transfers: Sit to/from Stand Sit to Stand: Min guard         General transfer comment: min guard for safety, no LOB but increased time for transfer   Ambulation/Gait Ambulation/Gait assistance: Min guard Ambulation Distance (Feet): 30 Feet Assistive device: Rolling walker (2 wheeled) Gait Pattern/deviations: Decreased step length - left;Decreased stance time - right;Step-through pattern;Decreased stride length;Decreased weight shift to right;Trunk flexed;Narrow base of support     General Gait Details: signficant reduced weightbearing on R LE, general tremoring during gait, toe walking noted R especially, severe functional weakness noted R LE during gait   Stairs            Wheelchair Mobility    Modified Rankin (Stroke Patients Only)       Balance Overall balance assessment: Needs assistance Sitting-balance support: Bilateral upper extremity  supported Sitting balance-Leahy Scale: Good Sitting balance - Comments: core sterngth generally 3/5    Standing balance support: Bilateral upper extremity supported Standing balance-Leahy Scale: Fair                                Pertinent Vitals/Pain Pain Assessment: No/denies pain    Home Living Family/patient expects to be discharged to:: Private residence Living Arrangements: Alone Available Help at Discharge: Family;Available 24 hours/day Type of Home: House Home Access: Stairs to enter Entrance Stairs-Rails: Can reach both Entrance Stairs-Number of Steps: 6   Home Equipment: None      Prior Function Level of Independence: Independent               Hand Dominance   Dominant Hand: Right    Extremity/Trunk Assessment   Upper Extremity Assessment: Defer to OT evaluation;Generalized weakness RUE Deficits / Details: A/ROM WNL, strength 3/5     LUE Deficits / Details: A/ROM WNL; hx of weakness post CVA in 2012, strength 4-/5   Lower Extremity Assessment: Generalized weakness      Cervical / Trunk Assessment: Normal  Communication   Communication: No difficulties  Cognition Arousal/Alertness: Awake/alert Behavior During Therapy: Flat affect Overall Cognitive Status: Difficult to assess       Memory: Decreased short-term memory              General Comments      Exercises        Assessment/Plan    PT Assessment Patient needs continued PT services  PT Diagnosis Difficulty walking;Abnormality of gait;Generalized weakness   PT Problem List Decreased strength;Decreased coordination;Decreased activity tolerance;Decreased knowledge of use of DME;Decreased balance;Decreased mobility  PT Treatment Interventions DME instruction;Therapeutic exercise;Gait training;Balance training;Stair training;Neuromuscular re-education;Functional mobility training;Therapeutic activities;Patient/family education   PT Goals (Current goals can be found in the Care Plan section) Acute Rehab PT Goals Patient Stated Goal: go home  PT Goal Formulation: With patient Time For Goal Achievement: 12/25/15 Potential to Achieve Goals: Good    Frequency 7X/week   Barriers to discharge         Co-evaluation               End of Session Equipment Utilized During Treatment: Gait belt Activity Tolerance: Patient tolerated treatment well;Patient limited by fatigue Patient left: in bed;with call bell/phone within reach;with family/visitor present Nurse Communication: Mobility status;Other (comment) (patient moving well, may be appropriate for CIR; feeling nauseous/sick at end of session and asking for meds to address this )    Functional Assessment Tool Used: Based on skilled clinical assessment of strength, gait, balance, transfers  Functional Limitation: Mobility: Walking and moving around Mobility: Walking and Moving Around Current Status (K5993): At least 40 percent but less than 60 percent impaired, limited or restricted Mobility: Walking and Moving Around Goal Status [phone]): At least 20 percent but less than 40 percent impaired, limited or restricted    Time: 0940-1000 PT Time Calculation (min) (ACUTE ONLY): 20 min   Charges:   PT Evaluation $PT Eval Moderate Complexity: 1 Procedure     PT G Codes:   PT G-Codes **NOT FOR INPATIENT CLASS** Functional Assessment Tool Used: Based on skilled clinical assessment of strength, gait, balance, transfers  Functional Limitation: Mobility: Walking and moving around Mobility: Walking and Moving Around Current Status (B9390): At least 40 percent but less than 60 percent impaired, limited or restricted Mobility: Walking and Moving Around Goal Status [phone]): At  least 20 percent but less than 40 percent impaired, limited or restricted    Deniece Ree PT, DPT [phone]

## 2015-12-11 NOTE — Evaluation (Signed)
Occupational Therapy Evaluation Patient Details Name: Jaleea Alesi MRN: [id number] DOB: 1971-01-31 Today's Date: 12/11/2015    History of Present Illness Teshia Mahone is a 45 y.o. female with medical history significant for depression, anxiety, PTSD, Crohn's disease, hypothyroidism, and chronic pain who presents to the ED with acute weakness and slurred speech. Patient reportedly woke in her usual state of health and had an uneventful day until approximately 11:50 AM, when after having a heated argument with her 66 year old son, she laid her head in her hands, had some garbled speech, and was unable to move. This 12 year old son reportedly lifted his limp mother, carrying her to the car, and transporting her to the emergency department. Patient denies any preceding or associated chest pain, palpitations, dizziness, loss of coordination, change in vision, change in hearing, or focal paresthesia. She has never experienced similar symptoms previously.   Clinical Impression   Pt awake, alert, oriented to person and place, agreeable to OT evaluation. Pt continually asking for food and coffee during evaluation, even with explanation from nsg and OT on NPO status. 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 Upon evaluation pt presents with right side weakness, decreased grip strength, and decreased coordination greater than the left. Pt presents with involuntary jerking motions throughout body-BLE, BUE, and torso upon standing, unable to perform functional mobility safely without walker. Jerking motions improved/decreased with use of walker, however still present in BLE. Pt reports this "happens sometimes" as well. Pt would  benefit from continued OT services to further assess functioning during ADL tasks, increase independence and safety during ADL tasks, and increase strength and coordination of RUE. Recommend outpatient OT services on discharge, as pt will have 24/7 assistance at home.      Follow Up Recommendations  Outpatient OT;Supervision/Assistance - 24 hour    Equipment Recommendations  Tub/shower seat       Precautions / Restrictions Precautions Precautions: Fall Restrictions Weight Bearing Restrictions: No      Mobility Bed Mobility Overal bed mobility: Modified Independent                Transfers Overall transfer level: Needs assistance Equipment used: Rolling walker (2 wheeled) Transfers: Sit to/from Stand Sit to Stand: Min guard         General transfer comment: Pt able to perform transfer with min guard due to LOB, pushing up from bed with BUE          ADL Overall ADL's : Needs assistance/impaired                                     Functional mobility during ADLs: Min guard;Cueing for safety;Rolling walker General ADL Comments: Pt declined to perform ADL tasks during evaluation. On observation, pt able to complete tasks such as using phone, using call bell functionally, however increased time required to complete tasks due to weakness and decreased coordination     Vision Vision Assessment?: No apparent visual deficits          Pertinent Vitals/Pain Pain Assessment: No/denies pain     Hand Dominance Right   Extremity/Trunk Assessment Upper Extremity Assessment Upper Extremity Assessment: RUE  deficits/detail;LUE deficits/detail RUE Deficits / Details: A/ROM WNL, strength 3/5 RUE Coordination: decreased fine motor;decreased gross motor LUE Deficits / Details: A/ROM WNL; hx of weakness post CVA in 2012, strength 4-/5 LUE Coordination: decreased fine motor   Lower Extremity Assessment Lower Extremity Assessment: Defer to PT evaluation    Cervical / Trunk Assessment Cervical / Trunk Assessment: Normal   Communication Communication Communication: No difficulties   Cognition Arousal/Alertness: Awake/alert Behavior During Therapy: Flat affect Overall Cognitive Status: Difficult to assess       Memory: Decreased short-term memory                        Home Living Family/patient expects to be discharged to:: Private residence Living Arrangements: Spouse/significant other;Children Available Help at Discharge: Family;Available 24 hours/day Type of Home: House             Bathroom Shower/Tub: Chief Strategy Officer: Standard     Home Equipment: None          Prior Functioning/Environment Level of Independence: Independent             OT Diagnosis: Generalized weakness;Cognitive deficits;Hemiplegia non-dominant side   OT Problem List: Decreased strength;Decreased activity tolerance;Impaired balance (sitting and/or standing);Decreased cognition;Decreased coordination;Decreased safety awareness;Decreased knowledge of use of DME or AE;Impaired UE functional use   OT Treatment/Interventions: Self-care/ADL training;Therapeutic exercise;Therapeutic activities;Patient/family education    OT Goals(Current goals can be found in the care plan section) Acute Rehab OT Goals Patient Stated Goal: none stated OT Goal Formulation: With patient Time For Goal Achievement: 12/25/15 Potential to Achieve Goals: Good  OT Frequency: Min 2X/week    End of Session Equipment Utilized During Treatment: Gait belt;Rolling walker  Activity Tolerance: Patient limited by lethargy Patient left: in bed;with call bell/phone within reach   Time: 0831-0903 OT Time Calculation (min): 32 min Charges:  OT General Charges $OT Visit: 1 Procedure OT Evaluation $OT Eval Low Complexity: 1 Procedure G-Codes: OT G-codes **NOT FOR INPATIENT CLASS** Functional Assessment Tool Used: clinical judgement Functional  Limitation: Self care Self Care Current Status (U9811): At least 20 percent but less than 40 percent impaired, limited or restricted Self Care Goal Status (B1478): At least 20 percent but less than 40 percent impaired, limited or restricted Self Care Discharge Status [id number]): At least 20 percent but less than 40 percent impaired, limited or restricted  Ezra Sites, OTR/L  [phone]  12/11/2015, 9:16 AM

## 2015-12-12 LAB — HEMOGLOBIN A1C
Hgb A1c MFr Bld: 5.7 % — ABNORMAL HIGH (ref 4.8–5.6)
MEAN PLASMA GLUCOSE: 117 mg/dL

## 2016-12-11 ENCOUNTER — Telehealth (HOSPITAL_COMMUNITY): Payer: Self-pay

## 2016-12-11 ENCOUNTER — Encounter (HOSPITAL_COMMUNITY): Payer: Self-pay | Admitting: Emergency Medicine

## 2016-12-11 ENCOUNTER — Emergency Department (HOSPITAL_COMMUNITY)
Admission: EM | Admit: 2016-12-11 | Discharge: 2016-12-13 | Disposition: A | Discharge status: Other Acute Inpatient Hospital | Attending: Emergency Medicine | Admitting: Emergency Medicine

## 2016-12-11 DIAGNOSIS — E039 Hypothyroidism, unspecified: Secondary | ICD-10-CM | POA: Insufficient documentation

## 2016-12-11 DIAGNOSIS — T424X2A Poisoning by benzodiazepines, intentional self-harm, initial encounter: Secondary | ICD-10-CM | POA: Insufficient documentation

## 2016-12-11 DIAGNOSIS — Z91040 Latex allergy status: Secondary | ICD-10-CM | POA: Insufficient documentation

## 2016-12-11 DIAGNOSIS — Z79899 Other long term (current) drug therapy: Secondary | ICD-10-CM | POA: Insufficient documentation

## 2016-12-11 DIAGNOSIS — Y9389 Activity, other specified: Secondary | ICD-10-CM | POA: Insufficient documentation

## 2016-12-11 DIAGNOSIS — Y998 Other external cause status: Secondary | ICD-10-CM | POA: Insufficient documentation

## 2016-12-11 DIAGNOSIS — Z87891 Personal history of nicotine dependence: Secondary | ICD-10-CM | POA: Diagnosis not present

## 2016-12-11 DIAGNOSIS — F329 Major depressive disorder, single episode, unspecified: Secondary | ICD-10-CM | POA: Diagnosis not present

## 2016-12-11 DIAGNOSIS — T1491XA Suicide attempt, initial encounter: Secondary | ICD-10-CM | POA: Diagnosis not present

## 2016-12-11 DIAGNOSIS — Z9049 Acquired absence of other specified parts of digestive tract: Secondary | ICD-10-CM | POA: Diagnosis not present

## 2016-12-11 DIAGNOSIS — T50902A Poisoning by unspecified drugs, medicaments and biological substances, intentional self-harm, initial encounter: Secondary | ICD-10-CM

## 2016-12-11 DIAGNOSIS — Y929 Unspecified place or not applicable: Secondary | ICD-10-CM | POA: Insufficient documentation

## 2016-12-11 DIAGNOSIS — X838XXA Intentional self-harm by other specified means, initial encounter: Secondary | ICD-10-CM | POA: Diagnosis not present

## 2016-12-11 LAB — CBC WITH DIFFERENTIAL/PLATELET
BASOS ABS: 0.1 10*3/uL (ref 0.0–0.1)
BASOS PCT: 1 %
EOS ABS: 0.3 10*3/uL (ref 0.0–0.7)
EOS PCT: 4 %
HCT: 37.4 % (ref 36.0–46.0)
Hemoglobin: 12.6 g/dL (ref 12.0–15.0)
LYMPHS ABS: 2.1 10*3/uL (ref 0.7–4.0)
Lymphocytes Relative: 33 %
MCH: 31.2 pg (ref 26.0–34.0)
MCHC: 33.7 g/dL (ref 30.0–36.0)
MCV: 92.6 fL (ref 78.0–100.0)
Monocytes Absolute: 0.6 10*3/uL (ref 0.1–1.0)
Monocytes Relative: 10 %
Neutro Abs: 3.2 10*3/uL (ref 1.7–7.7)
Neutrophils Relative %: 52 %
Platelets: 324 10*3/uL (ref 150–400)
RBC: 4.04 MIL/uL (ref 3.87–5.11)
RDW: 13.2 % (ref 11.5–15.5)
WBC: 6.2 10*3/uL (ref 4.0–10.5)

## 2016-12-11 LAB — COMPREHENSIVE METABOLIC PANEL
ALT: 12 U/L — ABNORMAL LOW (ref 14–54)
ANION GAP: 5 (ref 5–15)
AST: 15 U/L (ref 15–41)
Albumin: 3.5 g/dL (ref 3.5–5.0)
Alkaline Phosphatase: 74 U/L (ref 38–126)
BILIRUBIN TOTAL: 0.6 mg/dL (ref 0.3–1.2)
BUN: 11 mg/dL (ref 6–20)
CHLORIDE: 111 mmol/L (ref 101–111)
CO2: 26 mmol/L (ref 22–32)
Calcium: 9 mg/dL (ref 8.9–10.3)
Creatinine, Ser: 0.72 mg/dL (ref 0.44–1.00)
GFR calc non Af Amer: >60 mL/min (ref >60)
Glucose, Bld: 140 mg/dL — ABNORMAL HIGH (ref 65–99)
POTASSIUM: 3.8 mmol/L (ref 3.5–5.1)
Sodium: 142 mmol/L (ref 135–145)
TOTAL PROTEIN: 6.6 g/dL (ref 6.5–8.1)

## 2016-12-11 LAB — I-STAT BETA HCG BLOOD, ED (MC, WL, AP ONLY): I-stat hCG, quantitative: <5 m[IU]/mL (ref <5)

## 2016-12-11 LAB — RAPID URINE DRUG SCREEN, HOSP PERFORMED
Amphetamines: POSITIVE — AB
BARBITURATES: NOT DETECTED
BENZODIAZEPINES: POSITIVE — AB
Cocaine: NOT DETECTED
Opiates: NOT DETECTED
TETRAHYDROCANNABINOL: NOT DETECTED

## 2016-12-11 LAB — SALICYLATE LEVEL: Salicylate Lvl: <7 mg/dL (ref 2.8–30.0)

## 2016-12-11 LAB — CBG MONITORING, ED: Glucose-Capillary: 61 mg/dL — ABNORMAL LOW (ref 65–99)

## 2016-12-11 LAB — ACETAMINOPHEN LEVEL: Acetaminophen (Tylenol), Serum: <10 ug/mL — ABNORMAL LOW (ref 10–30)

## 2016-12-11 LAB — ETHANOL

## 2016-12-11 MED ORDER — THIAMINE HCL 100 MG/ML IJ SOLN
100.0000 mg | Freq: Every day | INTRAMUSCULAR | Status: DC
  Administered 2016-12-11 – 2016-12-12 (×2): 100 mg via INTRAVENOUS
  Filled 2016-12-11 (×2): qty 2

## 2016-12-11 MED ORDER — DEXTROSE 50 % IV SOLN
INTRAVENOUS | Status: AC
  Filled 2016-12-11: qty 50

## 2016-12-11 MED ORDER — DEXTROSE 50 % IV SOLN
1.0000 | Freq: Once | INTRAVENOUS | Status: AC
  Administered 2016-12-11: 50 mL via INTRAVENOUS

## 2016-12-11 MED ORDER — LORAZEPAM 2 MG/ML IJ SOLN: 0.0000 mg | Freq: Two times a day (BID) | INTRAMUSCULAR | Status: DC

## 2016-12-11 MED ORDER — LORAZEPAM 1 MG PO TABS: 0.0000 mg | ORAL_TABLET | Freq: Two times a day (BID) | ORAL | Status: DC

## 2016-12-11 MED ORDER — LORAZEPAM 2 MG/ML IJ SOLN: 0.0000 mg | Freq: Four times a day (QID) | INTRAMUSCULAR | Status: AC

## 2016-12-11 MED ORDER — LORAZEPAM 1 MG PO TABS
0.0000 mg | ORAL_TABLET | Freq: Four times a day (QID) | ORAL | Status: AC
Start: 2016-12-11 — End: 2016-12-13

## 2016-12-11 MED ORDER — VITAMIN B-1 100 MG PO TABS
100.0000 mg | ORAL_TABLET | Freq: Every day | ORAL | Status: DC
  Administered 2016-12-13: 100 mg via ORAL
  Filled 2016-12-11: qty 1

## 2016-12-11 MED ORDER — NALOXONE HCL 0.4 MG/ML IJ SOLN
1.0000 mg | Freq: Once | INTRAMUSCULAR | Status: AC
  Administered 2016-12-11: 1 mg via INTRAVENOUS
  Filled 2016-12-11: qty 3

## 2016-12-11 NOTE — ED Triage Notes (Signed)
Pt brought in EMS, pt ingested unknown number of xanax today. Pt reports SI to EDP during assessment. Pt alert and combative during assessment. cbg en route 88. Airway patent.

## 2016-12-11 NOTE — ED Notes (Signed)
Husband reports that pt has been talking about divorcing him. She proceeds to take a bottle of xanax. Pt took a sip of coke and husband knocked bottle out of hand and pt did spit some of pills out. Unsure how many pills pt swallowed. Pt has emotional service dog and husband briefly visited and then left. Pt has episodes of sleeping and asking of for her dog. Has been pulling tubing off such as capnography and ekg leads offs

## 2016-12-11 NOTE — ED Notes (Signed)
CBG 61. Order from Dr Hyacinth Meeker. Amp D50

## 2016-12-11 NOTE — ED Notes (Signed)
Husband Maurine Minister  [phone]

## 2016-12-11 NOTE — ED Provider Notes (Signed)
[facility]-EMER[facility]ENCY DEPT Provider Note   CSN: [id number] Arrival date & time: 12/11/16  1658     History   Chief Complaint Chief Complaint  Patient presents with  . Drug Overdose    HPI Nichole Aguilar is a 46 y.o. female.  HPI  46 y/o female, hx of anxiety, depression and on xanax intyermittently - reportedly had witnessed OD of xanax by husband per EMS - is sedated, sleepy, difficult to rouse - occurred just prior to arrival, unsure if coingestants.  Pt states "I'm Done" when asked why she took the medicines.  She has hx of emotional instability and has comfort service dogs at home that she continues to ask for - level 5 caveat applies secondary to her altered state.  No narcan given pre hospital  Husband arrives and states that she has hx of Depression - not on any  meds other than PRN xanax - has been in relationship struggle - wants a divorce but th spouse refuses, they had argument last night and seh took xanax and went to sleep - awoke, left the room this afternoon and took a handful of xanax which she kept in her mouth and was drinking fluids trying to "solid swallow" by disolving the medicine and swallow the saliva so that her stomach couldn't be "pumped".  She ultimately was able to spit out the solid mass of pills according to the husband but admitted to him that she had swallowed several down.  Past Medical History:  Diagnosis Date  . Anxiety   . Crohn's disease (HCC) 2002  . Crohn's disease (HCC) 12/13/2013  . Depression   . Dyspareunia   . Fibromyalgia 2000  . Hypothyroidism 12/10/2015  . Lupus   . Stroke [facility]) 2012   left sided weakness    Patient Active Problem List   Diagnosis Date Noted  . Weakness 12/10/2015  . Anxiety 12/10/2015  . Hypothyroidism 12/10/2015  . Chronic pain 12/10/2015  . Weakness of right side of body 12/10/2015  . Emotional crisis, acute reaction to stress   . Crohn's disease (HCC) 12/13/2013  . Depression     Past Surgical History:    Procedure Laterality Date  . ABDOMINAL HYSTERECTOMY    . ANKLE SURGERY    . CHOLECYSTECTOMY  1997   laparoscopic  . ELBOW SURGERY    . HAND SURGERY    . TOTAL ABDOMINAL HYSTERECTOMY W/ BILATERAL SALPINGOOPHORECTOMY  2008   recurrent ovarian cysts    OB History    Gravida Para Term Preterm AB Living   4 4 4     4    SAB TAB Ectopic Multiple Live Births                   Home Medications    Prior to Admission medications   Medication Sig Start Date End Date Taking? Authorizing Provider  ALPRAZolam Prudy Feeler) 1 MG tablet Take 0.5-1 mg by mouth daily as needed for anxiety.  09/20/13   [provider]  amphetamine-dextroamphetamine (ADDERALL XR) 20 MG 24 hr capsule Take 40 mg by mouth daily.    [provider]  citalopram (CELEXA) 40 MG tablet Take 40 mg by mouth daily.  09/21/13   [provider]  SYNTHROID 50 MCG tablet Take 50 mcg by mouth daily before breakfast.  09/21/13   [provider]    Family History Family History  Problem Relation Age of Onset  . Hodgkin's lymphoma Mother   . Stroke Mother   . Hypertension  Father   . Diabetes type II Father   . Cancer Father   . Breast cancer Maternal Aunt   . Osteoporosis Unknown        Paternal side    Social History Social History  Substance Use Topics  . Smoking status: Former Smoker    Quit date: 07/12/2010  . Smokeless tobacco: Never Used  . Alcohol use No     Allergies   Codeine; Geodon [ziprasidone hcl]; Wellbutrin [bupropion]; Dilaudid [hydromorphone hcl]; Hydrocodone; Latex; and Tagamet [cimetidine]   Review of Systems Review of Systems  Unable to perform ROS: Mental status change     Physical Exam Updated Vital Signs BP 107/73 (BP Location: Left Arm)   Pulse 66   Temp 98.4 F (36.9 C) (Oral)   Resp 18   SpO2 100%   Physical Exam  Constitutional: She appears well-developed and well-nourished. No distress.  HENT:  Head: Normocephalic and atraumatic.   Mouth/Throat: Oropharynx is clear and moist. No oropharyngeal exudate.  Eyes: Conjunctivae and EOM are normal. Pupils are equal, round, and reactive to light. Right eye exhibits no discharge. Left eye exhibits no discharge. No scleral icterus.  Neck: Normal range of motion. Neck supple. No JVD present. No thyromegaly present.  Cardiovascular: Normal rate, regular rhythm, normal heart sounds and intact distal pulses.  Exam reveals no gallop and no friction rub.   No murmur heard. Pulmonary/Chest: Effort normal and breath sounds normal. No respiratory distress. She has no wheezes. She has no rales.  Abdominal: Soft. Bowel sounds are normal. She exhibits no distension and no mass. There is no tenderness.  Musculoskeletal: Normal range of motion. She exhibits no edema or tenderness.  Lymphadenopathy:    She has no cervical adenopathy.  Neurological:  The pt is able to move all 4 extremities to painful stimuli - does not answer any questions other than saying "I'm done" and "i want my dog".  She does not follow commands.  No obvious facial droop.   Skin: Skin is warm and dry. No rash noted. No erythema.  Psychiatric: She has a normal mood and affect. Her behavior is normal.  Nursing note and vitals reviewed.    ED Treatments / Results  Labs (all labs ordered are listed, but only abnormal results are displayed) Labs Reviewed  COMPREHENSIVE METABOLIC PANEL - Abnormal; Notable for the following:       Result Value   Glucose, Bld 140 (*)    ALT 12 (*)    All other components within normal limits  RAPID URINE DRUG SCREEN, HOSP PERFORMED - Abnormal; Notable for the following:    Benzodiazepines POSITIVE (*)    Amphetamines POSITIVE (*)    All other components within normal limits  ACETAMINOPHEN LEVEL - Abnormal; Notable for the following:    Acetaminophen (Tylenol), Serum <10 (*)    All other components within normal limits  CBG MONITORING, ED - Abnormal; Notable for the following:     Glucose-Capillary 61 (*)    All other components within normal limits  ETHANOL  CBC WITH DIFFERENTIAL/PLATELET  SALICYLATE LEVEL  I-STAT BETA HCG BLOOD, ED ([facility], [facility], [facility] ONLY)    EKG  EKG Interpretation  Date/Time:  Saturday December 11 2016 17:06:56 EDT Ventricular Rate:  85 PR Interval:    QRS Duration: 83 QT Interval:  400 QTC Calculation: 476 R Axis:   29 Text Interpretation:  Sinus rhythm Nonspecific T wave abnormality ECG OTHERWISE WITHIN NORMAL LIMITS since last tracing no significant change Confirmed by  Eber Hong ([id number]) on 12/11/2016 9:24:34 PM       Radiology No results found.  Procedures Procedures (including critical care time)  Medications Ordered in ED Medications  LORazepam (ATIVAN) injection 0-4 mg (0 mg Intravenous Not Given 12/11/16 1856)    Or  LORazepam (ATIVAN) tablet 0-4 mg ( Oral See Alternative 12/11/16 1856)  LORazepam (ATIVAN) injection 0-4 mg (not administered)    Or  LORazepam (ATIVAN) tablet 0-4 mg (not administered)  thiamine (VITAMIN B-1) tablet 100 mg (not administered)    Or  thiamine (B-1) injection 100 mg (not administered)  naloxone (NARCAN) injection 1 mg (1 mg Intravenous Given 12/11/16 1719)  dextrose 50 % solution 50 mL (50 mLs Intravenous Given 12/11/16 1733)     Initial Impression / Assessment and Plan / ED Course  I have reviewed the triage vital signs and the nursing notes.  Pertinent labs & imaging results that were available during my care of the patient were reviewed by me and considered in my medical decision making (see chart for details).    The pt is ill appaering with potential toxic OD. Check for coingestants Monitor vitals / airway Narcan ECG Talk to family when arrive.  Multiple reevaluations - has minimal O2 by [place] but has remained somnolent but arousable - no decline in her airway or mental status - she needs evaluation by psycahiatry but due to her significant OD and somnolent to obtunded state they have been  unable to do this up to this point.  At change of shift - the pt is still somnolent - her labs are unremarkable and show no obvious coingestants - she has normal APAP and ASA levels (undetectable).  Will check a 4 hour level, otherwise, needs to sober and talk to psych.  IVC has been signed by myself.  CRITICAL CARE Performed by: Vida Roller Total critical care time: 35 minutes Critical care time was exclusive of separately billable procedures and treating other patients. Critical care was necessary to treat or prevent imminent or life-threatening deterioration. Critical care was time spent personally by me on the following activities: development of treatment plan with patient and/or surrogate as well as nursing, discussions with consultants, evaluation of patient's response to treatment, examination of patient, obtaining history from patient or surrogate, ordering and performing treatments and interventions, ordering and review of laboratory studies, ordering and review of radiographic studies, pulse oximetry and re-evaluation of patient's condition.   Final Clinical Impressions(s) / ED Diagnoses   Final diagnoses:  Intentional drug overdose, initial encounter [facility])  Suicide attempt [facility])    New Prescriptions New Prescriptions   No medications on file     Eber Hong, MD 12/11/16 2126

## 2016-12-11 NOTE — ED Notes (Signed)
Family here to see pt- given visiting hours and they will return in the am

## 2016-12-11 NOTE — [facility] Counselor (Signed)
(  1713) Counselor attempted to complete consult with Patient through tele-assessment.  Patient was unresponsive and unable to be woken up by Zella Ball, RN and NT after several attempts during the assessment.  RN reported Patient having an upcoming dosage of Narcan.    (1721) Counselor corresponded with Eber Hong, MD about Patient's current functioning ability.  MD confirmed canceling consult and completing a new one when Patient was capable of communicating.  Elmore Guise, LPCA & LCAS Therapeutic Triage Specialist [phone]

## 2016-12-12 ENCOUNTER — Encounter (HOSPITAL_COMMUNITY): Payer: Self-pay | Admitting: Cardiology

## 2016-12-12 MED ORDER — LORAZEPAM 2 MG/ML IJ SOLN
2.0000 mg | Freq: Once | INTRAMUSCULAR | Status: AC
  Administered 2016-12-12: 2 mg via INTRAMUSCULAR
  Filled 2016-12-12: qty 1

## 2016-12-12 NOTE — ED Notes (Addendum)
Pt continually arguing at staff and sitter.  explained to pt that she could not have her dog as she is unable to take the dog outside, feed it or care for its needs due to IVC. Continues to argue and yell at staff. Pt very agitated and unable to reason with pt.  Saying "get me my fucking dog" Spoke with Dr Verdie Mosher gave order to give Ativan 2 mg IM for agitation. Security and charge nurse present

## 2016-12-12 NOTE — ED Notes (Signed)
Patient is ripping off blood pressure cuff and cardiac leads, will manual take blood pressure during hourly rounding, informed patient if she is not compliant, PD would be called for assistance.

## 2016-12-12 NOTE — ED Notes (Signed)
TTS in progress 

## 2016-12-12 NOTE — ED Notes (Signed)
Environment/Room secured

## 2016-12-12 NOTE — ED Notes (Signed)
Husband and daughter in to visit.. Family brought service dog. Pt verbally abusive and cussing at husband and daughter. Family left

## 2016-12-12 NOTE — ED Notes (Addendum)
Pt yelling at staff intermittently and stating I want my fucking dog.

## 2016-12-12 NOTE — ED Notes (Signed)
Pt ambulated to BR with assistance. Pt rude with staff and disagrees with all staff answers when she has a question. Pt family in to visit pt

## 2016-12-12 NOTE — ED Notes (Signed)
Sheriff Deputy called and is speaking with patient

## 2016-12-12 NOTE — ED Notes (Addendum)
Pt not cooperative with TTS in ED. Discussed case with AC, AC recommends pt being potential medical admission. Spoke with EDP physician and he reports pt passive aggressive and uncooperative and reports pt does not meet admission criteria. AC aware of EDP decision.

## 2016-12-12 NOTE — ED Notes (Signed)
Pt sleeping but arousable telling staff to stop talking

## 2016-12-12 NOTE — [facility] Counselor (Signed)
CSW faxed inpatient referrals to [facility], [place], [facility], [place], [place], [facility] [place], and [place].   Daisy Floro Kyshon Tolliver MSW, LCSWA  12/12/2016 4:47 PM

## 2016-12-12 NOTE — BH Assessment (Signed)
Tele Assessment Note   Nichole Aguilar is an 46 y.o. female who presented to [facility] on 12/12/16 after intentional overdose of Xanax.  Per admitting physician:    "46 y/o female, hx of anxiety, depression and on xanax intermittently - reportedly had witnessed OD of xanax by husband per EMS - is sedated, sleepy, difficult to rouse - occurred just prior to arrival, unsure if coingestants.  Pt states "I'm Done" when asked why she took the medicines.  She has hx of emotional instability and has comfort service dogs at home that she continues to ask for...  Husband arrives and states that she has hx of Depression - not on any  meds other than PRN xanax - has been in relationship struggle - wants a divorce but th spouse refuses, they had argument last night and seh took xanax and went to sleep - awoke, left the room this afternoon and took a handful of xanax which she kept in her mouth and was drinking fluids trying to "solid swallow" by disolving the medicine and swallow the saliva so that her stomach couldn't be "pumped".  She ultimately was able to spit out the solid mass of pills according to the husband but admitted to him that she had swallowed several down."  Author contacted Pt via teleassessment cart.  Pt initially refused to acknowledge author on screen.  At nurse's prompting, Pt spoke.  Pt appeared very hostile to assessment.  When asked where she lives, she reported "Around."  When asked with whom she lives, she replied, "Family."  When asked for details, she refused.  Pt denied suicidal ideation, homicidal ideation, and auditory/visual hallucination.  Pt refused to answer when asked about her overdose on Xanax.  When advised that she was under IVC and could not leave, she said that she would escape.  Pt did not participate in assessment in any meaningful way.  Affect was very angry and suspicious.  Mood was apparently angry.  Author was unable to determine if Pt was oriented.  Demeanor was angry and  uncooperative.  Pt refused to explain her intentional overdose on Xanax.  Consulted with Irving Burton NP who recommended inpatient placement at this time.  Diagnosis: Major Depressive Disorder, Severe  Past Medical History:  Past Medical History:  Diagnosis Date  . Anxiety   . Crohn's disease (HCC) 2002  . Crohn's disease (HCC) 12/13/2013  . Depression   . Dyspareunia   . Fibromyalgia 2000  . Hypothyroidism 12/10/2015  . Lupus   . Stroke [facility]) 2012   left sided weakness    Past Surgical History:  Procedure Laterality Date  . ABDOMINAL HYSTERECTOMY    . ANKLE SURGERY    . CHOLECYSTECTOMY  1997   laparoscopic  . ELBOW SURGERY    . HAND SURGERY    . TOTAL ABDOMINAL HYSTERECTOMY W/ BILATERAL SALPINGOOPHORECTOMY  2008   recurrent ovarian cysts    Family History:  Family History  Problem Relation Age of Onset  . Hodgkin's lymphoma Mother   . Stroke Mother   . Hypertension Father   . Diabetes type II Father   . Cancer Father   . Breast cancer Maternal Aunt   . Osteoporosis Unknown        Paternal side    Social History:  reports that she quit smoking about 6 years ago. She has never used smokeless tobacco. She reports that she does not drink alcohol or use drugs.  Additional Social History:  Alcohol / Drug Use Pain Medications:  See MAR Prescriptions: See MAR Over the Counter: See MAR History of alcohol / drug use?: No history of alcohol / drug abuse  CIWA: CIWA-Ar BP: 95/60 Pulse Rate: 72 Nausea and Vomiting: no nausea and no vomiting Tactile Disturbances: none Tremor: no tremor Auditory Disturbances: not present Paroxysmal Sweats: no sweat visible Visual Disturbances: not present Anxiety: no anxiety, at ease Headache, Fullness in Head: none present Agitation: normal activity Orientation and Clouding of Sensorium: oriented and can do serial additions CIWA-Ar Total: 0 COWS:    PATIENT STRENGTHS: (choose at least two) Capable of independent living Physical  Health  Allergies:  Allergies  Allergen Reactions  . Codeine Nausea And Vomiting  . Geodon [Ziprasidone Hcl] Nausea And Vomiting  . Wellbutrin [Bupropion] Other (See Comments)    Reaction:  Hallucinations  . Dilaudid [Hydromorphone Hcl] Nausea And Vomiting and Other (See Comments)    Reaction:  Hallucinations  . Hydrocodone Nausea And Vomiting  . Latex Hives  . Tagamet [Cimetidine] Other (See Comments)    Reaction:  Unknown     Home Medications:  (Not in a hospital admission)  OB/GYN Status:  No LMP recorded. Patient has had a hysterectomy.  General Assessment Data Location of Assessment: [facility] TTS Assessment: In system Is this a Tele or Face-to-Face Assessment?: Tele Assessment Is this an Initial Assessment or a Re-assessment for this encounter?: Initial Assessment Marital status: Married Is patient pregnant?: No Pregnancy Status: No Living Arrangements: Spouse/significant other Can pt return to current living arrangement?: Yes Admission Status: Involuntary Is patient capable of signing voluntary admission?: Yes Referral Source: Self/Family/Friend Insurance type: Tricare     Crisis Care Plan Living Arrangements: Spouse/significant other Name of Psychiatrist: Unknown Name of Therapist: Unknown  Education Status Is patient currently in school?: No  Risk to self with the past 6 months Suicidal Ideation: Yes-Currently Present Has patient been a risk to self within the past 6 months prior to admission? : Yes Has patient had any suicidal intent within the past 6 months prior to admission? : Other (comment) (Unknown) Is patient at risk for suicide?: Yes Suicidal Plan?: Yes-Currently Present Has patient had any suicidal plan within the past 6 months prior to admission? : Other (comment) (Unknown) Specify Current Suicidal Plan: Pt intentionally overdosed on Xanax Access to Means: Yes Specify Access to Suicidal Means: Access to meds What has been your use of  drugs/alcohol within the last 12 months?: Unknown (UDS + Amphetamines and Benzos) Previous Attempts/Gestures:  (Unknown) Triggers for Past Attempts: Family contact (Precipitating event -- conflict w/husband) Intentional Self Injurious Behavior:  (Unknown) Family Suicide History: Unable to assess Recent stressful life event(s): Conflict (Comment) (Conflict with husband) Persecutory voices/beliefs?: No Depression: Yes Depression Symptoms: Feeling angry/irritable, Despondent Substance abuse history and/or treatment for substance abuse?: No (Unknown) Suicide prevention information given to non-admitted patients: Not applicable  Risk to Others within the past 6 months Homicidal Ideation: No Does patient have any lifetime risk of violence toward others beyond the six months prior to admission? : Unknown Thoughts of Harm to Others: No Current Homicidal Intent: No Current Homicidal Plan: No Access to Homicidal Means:  (Unknown) History of harm to others?:  (Unknown) Assessment of Violence: None Noted Does patient have access to weapons?:  (Unknown) Criminal Charges Pending?: No Does patient have a court date: No Is patient on probation?: Unknown  Psychosis Hallucinations: None noted Delusions: None noted  Mental Status Report Appearance/Hygiene: In scrubs, Unremarkable Eye Contact: Fair Motor Activity: Freedom of movement, Unremarkable Speech: Aggressive,  Argumentative Level of Consciousness: Alert Mood: Angry, Suspicious Affect: Angry Anxiety Level: None Thought Processes: Unable to Assess (Pt hostile to assessment) Judgement: Impaired Orientation: Unable to assess Obsessive Compulsive Thoughts/Behaviors: None  Cognitive Functioning Concentration: Unable to Assess Memory: Unable to Assess IQ: Average Insight: Poor Impulse Control: Poor Appetite:  (Unknown) Sleep: Unable to Assess Vegetative Symptoms: Unable to Assess  ADLScreening [facility] Assessment Services) Patient's  cognitive ability adequate to safely complete daily activities?: Yes Patient able to express need for assistance with ADLs?: Yes Independently performs ADLs?: Yes (appropriate for developmental age)  Prior Inpatient Therapy Prior Inpatient Therapy:  (Unknown)  Prior Outpatient Therapy Prior Outpatient Therapy:  (Unknown) Does patient have an ACCT team?: Unknown Does patient have Intensive In-House Services?  : Unknown Does patient have [facility] services? : Unknown Does patient have P4CC services?: Unknown  ADL Screening (condition at time of admission) Patient's cognitive ability adequate to safely complete daily activities?: Yes Is the patient deaf or have difficulty hearing?: No Does the patient have difficulty seeing, even when wearing glasses/contacts?: No Does the patient have difficulty concentrating, remembering, or making decisions?: No Patient able to express need for assistance with ADLs?: Yes Does the patient have difficulty dressing or bathing?: No Independently performs ADLs?: Yes (appropriate for developmental age) Does the patient have difficulty walking or climbing stairs?: No Weakness of Legs: None Weakness of Arms/Hands: None  Home Assistive Devices/Equipment Home Assistive Devices/Equipment: None  Therapy Consults (therapy consults require a physician order) PT Evaluation Needed: No OT Evalulation Needed: No SLP Evaluation Needed: No Abuse/Neglect Assessment (Assessment to be complete while patient is alone) Physical Abuse:  (Pt refused to participate in assessment) Values / Beliefs Cultural Requests During Hospitalization: None Spiritual Requests During Hospitalization: None Consults Spiritual Care Consult Needed: No Social Work Consult Needed: No Merchant navy officer (For Healthcare) Does Patient Have a Medical Advance Directive?: No    Additional Information 1:1 In Past 12 Months?: No CIRT Risk: No Elopement Risk: No Does patient have medical  clearance?: Yes     Disposition:  Disposition Initial Assessment Completed for this Encounter: Yes Disposition of Patient: Inpatient treatment program Type of inpatient treatment program: Adult (Per L. Arville Care, NP Pt meets inpt criteria)  Dorris Fetch Alioune Hodgkin 12/12/2016 1:01 PM

## 2016-12-13 ENCOUNTER — Encounter (HOSPITAL_COMMUNITY): Payer: Self-pay | Admitting: *Deleted

## 2016-12-13 ENCOUNTER — Encounter: Payer: Self-pay | Admitting: Psychiatry

## 2016-12-13 ENCOUNTER — Inpatient Hospital Stay
Admission: EM | Admit: 2016-12-13 | Discharge: 2016-12-14 | DRG: 885 | Disposition: A | Discharge status: Home / Self Care | Source: Intra-hospital | Attending: Psychiatry | Admitting: Psychiatry

## 2016-12-13 DIAGNOSIS — F909 Attention-deficit hyperactivity disorder, unspecified type: Secondary | ICD-10-CM | POA: Diagnosis present

## 2016-12-13 DIAGNOSIS — F332 Major depressive disorder, recurrent severe without psychotic features: Principal | ICD-10-CM | POA: Diagnosis present

## 2016-12-13 DIAGNOSIS — F4010 Social phobia, unspecified: Secondary | ICD-10-CM | POA: Diagnosis present

## 2016-12-13 DIAGNOSIS — Z885 Allergy status to narcotic agent status: Secondary | ICD-10-CM | POA: Diagnosis not present

## 2016-12-13 DIAGNOSIS — Z90710 Acquired absence of both cervix and uterus: Secondary | ICD-10-CM

## 2016-12-13 DIAGNOSIS — Z630 Problems in relationship with spouse or partner: Secondary | ICD-10-CM | POA: Diagnosis not present

## 2016-12-13 DIAGNOSIS — I69354 Hemiplegia and hemiparesis following cerebral infarction affecting left non-dominant side: Secondary | ICD-10-CM | POA: Diagnosis not present

## 2016-12-13 DIAGNOSIS — F4323 Adjustment disorder with mixed anxiety and depressed mood: Secondary | ICD-10-CM | POA: Diagnosis present

## 2016-12-13 DIAGNOSIS — F410 Panic disorder [episodic paroxysmal anxiety] without agoraphobia: Secondary | ICD-10-CM | POA: Diagnosis present

## 2016-12-13 DIAGNOSIS — Z79899 Other long term (current) drug therapy: Secondary | ICD-10-CM | POA: Diagnosis not present

## 2016-12-13 DIAGNOSIS — T424X2A Poisoning by benzodiazepines, intentional self-harm, initial encounter: Secondary | ICD-10-CM | POA: Diagnosis present

## 2016-12-13 DIAGNOSIS — Z888 Allergy status to other drugs, medicaments and biological substances status: Secondary | ICD-10-CM | POA: Diagnosis not present

## 2016-12-13 DIAGNOSIS — Z91040 Latex allergy status: Secondary | ICD-10-CM

## 2016-12-13 DIAGNOSIS — Z915 Personal history of self-harm: Secondary | ICD-10-CM

## 2016-12-13 DIAGNOSIS — E039 Hypothyroidism, unspecified: Secondary | ICD-10-CM | POA: Diagnosis present

## 2016-12-13 DIAGNOSIS — R45851 Suicidal ideations: Secondary | ICD-10-CM | POA: Diagnosis present

## 2016-12-13 DIAGNOSIS — F4310 Post-traumatic stress disorder, unspecified: Secondary | ICD-10-CM | POA: Diagnosis present

## 2016-12-13 DIAGNOSIS — K5090 Crohn's disease, unspecified, without complications: Secondary | ICD-10-CM | POA: Diagnosis present

## 2016-12-13 DIAGNOSIS — F1010 Alcohol abuse, uncomplicated: Secondary | ICD-10-CM | POA: Diagnosis present

## 2016-12-13 DIAGNOSIS — M797 Fibromyalgia: Secondary | ICD-10-CM | POA: Diagnosis present

## 2016-12-13 DIAGNOSIS — Z9049 Acquired absence of other specified parts of digestive tract: Secondary | ICD-10-CM | POA: Diagnosis not present

## 2016-12-13 MED ORDER — CITALOPRAM HYDROBROMIDE 20 MG PO TABS
40.0000 mg | ORAL_TABLET | Freq: Every day | ORAL | Status: DC
  Administered 2016-12-13: 40 mg via ORAL
  Filled 2016-12-13 (×3): qty 1

## 2016-12-13 MED ORDER — ALUM & MAG HYDROXIDE-SIMETH 200-200-20 MG/5ML PO SUSP: 30.0000 mL | ORAL | Status: DC | PRN

## 2016-12-13 MED ORDER — HYDROXYZINE HCL 25 MG PO TABS
50.0000 mg | ORAL_TABLET | Freq: Three times a day (TID) | ORAL | Status: DC | PRN
  Administered 2016-12-13: 50 mg via ORAL
  Filled 2016-12-13: qty 2

## 2016-12-13 MED ORDER — MAGNESIUM HYDROXIDE 400 MG/5ML PO SUSP: 30.0000 mL | Freq: Every day | ORAL | Status: DC | PRN

## 2016-12-13 MED ORDER — TRAZODONE HCL 100 MG PO TABS: 100.0000 mg | ORAL_TABLET | Freq: Every day | ORAL | Status: DC

## 2016-12-13 MED ORDER — HYDROXYZINE HCL 50 MG PO TABS: 50.0000 mg | ORAL_TABLET | Freq: Three times a day (TID) | ORAL | Status: DC | PRN

## 2016-12-13 MED ORDER — IBUPROFEN 800 MG PO TABS
800.0000 mg | ORAL_TABLET | Freq: Once | ORAL | Status: AC
  Administered 2016-12-13: 800 mg via ORAL
  Filled 2016-12-13: qty 1

## 2016-12-13 MED ORDER — CITALOPRAM HYDROBROMIDE 20 MG PO TABS
40.0000 mg | ORAL_TABLET | Freq: Every day | ORAL | Status: DC
  Filled 2016-12-13: qty 2

## 2016-12-13 MED ORDER — ACETAMINOPHEN 325 MG PO TABS: 650.0000 mg | ORAL_TABLET | Freq: Four times a day (QID) | ORAL | Status: DC | PRN

## 2016-12-13 NOTE — BHH Counselor (Signed)
TTS reassessment note:  Pt was agitated and upset during assessment. She minimizes why she was brought to the ED and states that she did not take an overdose of xanex even though it was witnessed by her husband. She states that her husband is "dramatic" and that she didn't take that many. She denies making the statement that she wanted to "solid swallow" by disolving the medicine and swallow the saliva so that her stomach couldn't be "pumped". She states that her husband "made that up". She says that she doesn't even remember coming to the hospital last night. When counselor asked her why she didn't remember she states "because she was tired". She says that she told her husband that she didn't want to be with him anymore and that's why he is "lying". Pt was agitated crying and angry stating she "doesn't need help". TTS explained that she is IVC'd and that she will have to stay unless an NP sees her and feels she is ok to discharge. TTS also explained that the plan is for her to go inpatient at this time and placement is being sought. She states that she "does not want to go and she doesn't need help." She currently goes to [facility] for medication management but does not see a therapist. Consulted with Dana Allan NP who determined that pt still needs inpatient placement and will not need to be seen again today by her. Dr. Estell Harpin and Meagan RN was made aware that TTS is looking for beds.   [address] [place], LCAS

## 2016-12-13 NOTE — BH Assessment (Signed)
Patient is to be admitted to [facility] by Dr. Jennet Maduro.  Attending Physician will be Dr. Jennet Maduro.   Patient has been assigned to room 323, by [facility] Charge Nurse Phyliss. IVC papers requested to be faxed to [phone].   Lindsey,AC informed of pt acceptance and will relay to Consulting civil engineer. Araina patient access informed for pre-admit.  Pt may arrive after 6p.

## 2016-12-13 NOTE — ED Notes (Addendum)
Pt agitated with nursing staff. RN called to room. Pt states, "why am I here, I didn't do anything". Pt reports she is unaware of how she got to the ED and denies ever being suicidal. Pt states, "I want to leave, I don't deserve to be here". Pt informed that she is under IVC. Nursing staff attempted to calm pt. Pt states, "I am pissed off about this". Pt requesting home medications, EDP made aware, order given to restart Celexa, hold Xanax and Adderall at this time. Pt now lying quietly in her room, looking away from nursing staff when questions are being asked.

## 2016-12-13 NOTE — ED Notes (Signed)
Pt c/o headache. EDP made aware of pt's continued agitation and  request for pain medication for headache.

## 2016-12-13 NOTE — ED Provider Notes (Signed)
Pt accepted to [facility]; will transfer stable.    Samuel Jester, DO 12/13/16 1756

## 2016-12-13 NOTE — Tx Team (Signed)
Initial Treatment Plan 12/13/2016 9:33 PM Nichole Aguilar OYD:[id number]    PATIENT STRESSORS: Marital or family conflict   PATIENT STRENGTHS: Wellsite geologist fund of knowledge   PATIENT IDENTIFIED PROBLEMS:   Depression  Suicide Attempt                 DISCHARGE CRITERIA:  Improved stabilization in mood, thinking, and/or behavior  PRELIMINARY DISCHARGE PLAN: Outpatient therapy  PATIENT/FAMILY INVOLVEMENT: This treatment plan has been presented to and reviewed with the patient, Nichole Aguilar, and/or family member.  The patient and family have been given the opportunity to ask questions and make suggestions.  Marion Downer, RN 12/13/2016, 9:33 PM

## 2016-12-13 NOTE — ED Notes (Addendum)
Pt continues to be verbally aggressive towards hospital staff. Pt yelling and cursing at staff. RN attempted to redirect pt with little success. Pt updated with plan of care. Pt has room availability at [facility]. Awaiting sheriff to transport, sheriff's office reports they can't send an officer until after 1800.

## 2016-12-13 NOTE — ED Notes (Addendum)
Pt continues to be verbally aggressive with nursing staff. Pt continues to state, "I don't know why I'm here, no no will tell me anything". Nursing staff has spoken with pt on several different occasions about plan of care. Pt has been informed of reason she was brought to ED and that [facility] multiple times throughout the day. Pt continues to yell and curse at staff. Security at bedside. [employer] here with another pt at this time but has tried to speak with pt about the process of IVC and psych hold, but pt continues to be verbally aggressive.

## 2016-12-14 DIAGNOSIS — F4323 Adjustment disorder with mixed anxiety and depressed mood: Secondary | ICD-10-CM

## 2016-12-14 MED ORDER — CITALOPRAM HYDROBROMIDE 20 MG PO TABS: 40.0000 mg | ORAL_TABLET | Freq: Every day | ORAL | Status: DC

## 2016-12-14 MED ORDER — CITALOPRAM HYDROBROMIDE 40 MG PO TABS: 40.0000 mg | ORAL_TABLET | Freq: Every day | ORAL | 1 refills | Status: DC

## 2016-12-14 NOTE — BHH Counselor (Signed)
No PSA was completed for this pt due to length of stay being less than 24 hours. Garner Nash, MSW, LCSW Clinical Social Worker 12/14/2016 10:46 AM

## 2016-12-14 NOTE — BHH Suicide Risk Assessment (Signed)
[facility] Admission Suicide Risk Assessment   Nursing information obtained from:  Patient Demographic factors:  Caucasian, Unemployed Current Mental Status:  NA Loss Factors:  Loss of significant relationship Historical Factors:  NA Risk Reduction Factors:  Responsible for children under 45 years of age, Living with another person, especially a relative, NA  Total Time spent with patient: 1 hour Principal Problem: Adjustment disorder with mixed anxiety and depressed mood Diagnosis:   Patient Active Problem List   Diagnosis Date Noted  . Major depressive disorder, recurrent severe without psychotic features (HCC) [F33.2] 12/13/2016  . Intentional benzodiazepine overdose (HCC) [T42.4X2A] 12/13/2016  . Adjustment disorder with mixed anxiety and depressed mood [F43.23] 12/13/2016  . Weakness [R53.1] 12/10/2015  . Anxiety [F41.9] 12/10/2015  . Hypothyroidism [E03.9] 12/10/2015  . Chronic pain [G89.29] 12/10/2015  . Weakness of right side of body [R53.1] 12/10/2015  . Emotional crisis, acute reaction to stress [F43.0]   . Crohn's disease (HCC) [K50.90] 12/13/2013  . Depression [F32.9]    Subjective Data: overdose.  Continued Clinical Symptoms:  Alcohol Use Disorder Identification Test Final Score (AUDIT): 0 The "Alcohol Use Disorders Identification Test", Guidelines for Use in Primary Care, Second Edition.  World Science writer [facility]). Score between 0-7:  no or low risk or alcohol related problems. Score between 8-15:  moderate risk of alcohol related problems. Score between 16-19:  high risk of alcohol related problems. Score 20 or above:  warrants further diagnostic evaluation for alcohol dependence and treatment.   CLINICAL FACTORS:   Severe Anxiety and/or Agitation Depression:   Impulsivity Previous Psychiatric Diagnoses and Treatments Medical Diagnoses and Treatments/Surgeries   Musculoskeletal: Strength & Muscle Tone: within normal limits Gait & Station: normal Patient  leans: N/A  Psychiatric Specialty Exam: Physical Exam  Nursing note and vitals reviewed. Psychiatric: She has a normal mood and affect. Her speech is normal and behavior is normal. Thought content normal. Cognition and memory are normal. She expresses impulsivity.    Review of Systems  Psychiatric/Behavioral: The patient is nervous/anxious.   All other systems reviewed and are negative.   Blood pressure (!) 110/93, pulse 72, temperature 98.2 F (36.8 C), temperature source Oral, resp. rate 19, height 5\' 1"  (1.549 m), weight 73.5 kg (162 lb), SpO2 100 %.Body mass index is 30.61 kg/m.  General Appearance: Casual  Eye Contact:  Good  Speech:  Clear and Coherent  Volume:  Normal  Mood:  Anxious  Affect:  Appropriate  Thought Process:  Goal Directed and Descriptions of Associations: Intact  Orientation:  Full (Time, Place, and Person)  Thought Content:  WDL  Suicidal Thoughts:  No  Homicidal Thoughts:  No  Memory:  Immediate;   Fair Recent;   Fair Remote;   Fair  Judgement:  Impaired  Insight:  Present  Psychomotor Activity:  Normal  Concentration:  Concentration: Fair and Attention Span: Fair  Recall:  [employer] of Knowledge:  Fair  Language:  Fair  Akathisia:  No  Handed:  Right  AIMS (if indicated):     Assets:  Communication Skills Desire for Improvement Financial Resources/Insurance Housing Resilience Social Support  ADL's:  Intact  Cognition:  WNL  Sleep:         COGNITIVE FEATURES THAT CONTRIBUTE TO RISK:  None    SUICIDE RISK:   Minimal: No identifiable suicidal ideation.  Patients presenting with no risk factors but with morbid ruminations; may be classified as minimal risk based on the severity of the depressive symptoms  PLAN OF CARE:  hospital admission, medication management, discharge planning.  Nichole Aguilar is a 46 year old female with a history of depression and anxiety admitted after overdose on Xanax in the context of marital problems.  1.  Suicidal ideation. The patient adamantly denies any thoughts, intention or plans to hurt herself or others. She is able to contract for safety in the hospital.  2. Mood. We contiued Celexa for depression.  3. ADHD. We will not offer stimulants as they worsen anxiety.  4. Anxiety. We will not offer Xanax as the patient overdosed on it.  5. Disposition. She will be discharged to home with her husband. She will follow up with her primary provider.   I certify that inpatient services furnished can reasonably be expected to improve the patient's condition.   Kristine Linea, MD 12/14/2016, 9:07 AM

## 2016-12-14 NOTE — Progress Notes (Signed)
Affect angry.  Patient verbalizes that she is agitated.  Refused to take celexa states that she takes it at night. Discharge instructions given, verbalized understanding.  Prescription given.  Personal belongings returned.  Escorted off unit by this Clinical research associate to meet husband to travel home.
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
## 2016-12-14 NOTE — Discharge Summary (Signed)
Physician Discharge Summary Note  Patient:  Nichole Aguilar is an 46 y.o., female MRN:  [id number] DOB:  March 01, 1971 Patient phone:  [phone] (home)  Patient address:   [address] [place]  [id number],  Total Time spent with patient: 1 hour  Date of Admission:  12/13/2016 Date of Discharge: 12/14/2016  Reason for Admission:  Overdose.  Identifying data. Nichole Aguilar is a 46 year old female with history of depression and anxiety.  Chief complaint. "It was stupid."  History of present illness. Information was obtained from the patient and the chart. The patient was brought to the hospital after she overdosed on her current form of Xanax from her husband during marital argument. According to the chart, the husband wants to divorce her but the patient refuses. The patient minimizes marital problems, stress is that they've been married for 26 years and this is not the first time they had difficulties. They've been to marriage counseling before. The patient adamantly denies any thoughts intentions or plans to hurt herself or others. She denies feeling suicidal at the time of overdose which was on Saturday 3 days ago. She is embarrassed and feels that it was stupid. She notes that she should have walked away from the situation she reports that she has been off her Celexa for several days as she did not pick it up from the pharmacy. The patient reports that while on Celexa, Adderall, and Xanax prescribed for years by her primary care provider she does not experience any symptoms of depression. She takes Xanax only as needed. Tthat's why she had a handful of at home. She denies ever having psychotic symptoms or symptoms suggestive of bipolar mania. She does endorse infrequent panic attacks, social anxiety, PTSD symptoms with nightmares and flashbacks. She denies OCD symptoms. She denies alcohol or illicit substance use. She has been abusing Xanax or Adderall.   Past psychiatric history. The patient  reports that she was hospitalized once many years ago. She denies ever attempting suicide. She has been on Celexa for over 10 years. She believes that her current regimen is the best.  in the past and the patient underwent psychotherapy in couples therapy but is no longer interested in therapy. She she underwent extensive psychological testing that identified depression and PTSD as major problems. Somehow she did not mention diagnosis of ADHD for which she is prescribed stimulants. 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 Interestingly the patient is allergic to Geodon. She does not remember ever being prescribed it. I would assume that it was given in the emergency room for agitation.   Family psychiatric history. Daughter with depression.  Social history. She lives with her husband of 26 years. She does not work outside the house. She has a history of medical problems including strokes. She has a sad and to detect elevated blood pressure that she has found extremely helpful for anxiety also. She has 4 children 3 of them are teenagers. At the patient reports that over the years there were multiple marital crisis that were sometimes addressed with couples therapy.  Principal Problem: Adjustment disorder with mixed anxiety and depressed mood Discharge Diagnoses: Patient Active Problem List   Diagnosis Date Noted  . Major depressive disorder, recurrent severe without psychotic features (HCC) [F33.2] 12/13/2016  . Intentional benzodiazepine overdose (HCC) [T42.4X2A] 12/13/2016  . Adjustment disorder with mixed anxiety and depressed mood [F43.23] 12/13/2016  . Weakness [R53.1] 12/10/2015  . Anxiety [F41.9] 12/10/2015  . Hypothyroidism [E03.9] 12/10/2015  .  Chronic pain [G89.29] 12/10/2015  . Weakness of right side of body [R53.1] 12/10/2015  . Emotional crisis, acute reaction to stress [F43.0]   . Crohn's disease (HCC) [K50.90] 12/13/2013  . Depression [F32.9]      Past Medical History:  Past Medical History:  Diagnosis Date  . Anxiety   . Crohn's disease (HCC) 2002  . Crohn's disease (HCC) 12/13/2013  . Depression   . Dyspareunia   . Fibromyalgia 2000  . Hypothyroidism 12/10/2015  . Lupus   . Stroke [facility]) 2012   left sided weakness    Past Surgical History:  Procedure Laterality Date  . ABDOMINAL HYSTERECTOMY    . ANKLE SURGERY    . CHOLECYSTECTOMY  1997   laparoscopic  . ELBOW SURGERY    . HAND SURGERY    . TOTAL ABDOMINAL HYSTERECTOMY W/ BILATERAL SALPINGOOPHORECTOMY  2008   recurrent ovarian cysts   Family History:  Family History  Problem Relation Age of Onset  . Hodgkin's lymphoma Mother   . Stroke Mother   . Hypertension Father   . Diabetes type II Father   . Cancer Father   . Breast cancer Maternal Aunt   . Osteoporosis Unknown        Paternal side   Social History:  History  Alcohol Use No     History  Drug Use No    Comment: UDS + for Amphetamines and Benzos    Social History   Social History  . Marital status: Married    Spouse name: Maurine Minister  . Number of children: 4  . Years of education: College   Social History Main Topics  . Smoking status: Former Smoker    Quit date: 07/12/2010  . Smokeless tobacco: Never Used  . Alcohol use No  . Drug use: No     Comment: UDS + for Amphetamines and Benzos  . Sexual activity: Yes   Other Topics Concern  . None   Social History Narrative   Patient is married Maurine Minister) and lives at home with her husband and three children.   Patient has four children.   Patient has a college education.   Patient drinks one cup of coffee daily and one soda daily.    Hospital Course:    Nichole Aguilar is a 46 year old female with a history of depression and anxiety admitted after overdose on Xanax in the context of marital problems.  1. Suicidal ideation. The patient adamantly denies any thoughts, intention or plans to hurt herself or others. She is able to contract for safety.  She is forward thinking and optimistic about the future. She is a lowing mother.   2. Mood. We contiued Celexa for depression.  3. ADHD. We will not offer stimulants as they worsen anxiety.  4. Anxiety. We will not offer Xanax as the patient overdosed on it.  5. Disposition. She was discharged to home with her husband. She is not interested in psychiatric care and will follow up with her primary provider.    Physical Findings: AIMS: Facial and Oral Movements Muscles of Facial Expression: None, normal Lips and Perioral Area: None, normal Jaw: None, normal Tongue: None, normal,Extremity Movements Upper (arms, wrists, hands, fingers): None, normal Lower (legs, knees, ankles, toes): None, normal, Trunk Movements Neck, shoulders, hips: None, normal, Overall Severity Severity of abnormal movements (highest score from questions above): None, normal Incapacitation due to abnormal movements: None, normal Patient's awareness of abnormal movements (rate only patient's report): No Awareness, Dental Status Current problems with teeth  and/or dentures?: No Does patient usually wear dentures?: No  CIWA:    COWS:     Musculoskeletal: Strength & Muscle Tone: within normal limits Gait & Station: normal Patient leans: N/A  Psychiatric Specialty Exam: Physical Exam  Nursing note and vitals reviewed. Psychiatric: She has a normal mood and affect. Her speech is normal and behavior is normal. Thought content normal. Cognition and memory are normal. She expresses impulsivity.    Review of Systems  Psychiatric/Behavioral: The patient is nervous/anxious.   All other systems reviewed and are negative.   Blood pressure (!) 110/93, pulse 72, temperature 98.2 F (36.8 C), temperature source Oral, resp. rate 19, height 5\' 1"  (1.549 m), weight 73.5 kg (162 lb), SpO2 100 %.Body mass index is 30.61 kg/m.  General Appearance: Casual  Eye Contact:  Good  Speech:  Clear and Coherent  Volume:  Normal   Mood:  Anxious  Affect:  Appropriate  Thought Process:  Goal Directed and Descriptions of Associations: Intact  Orientation:  Full (Time, Place, and Person)  Thought Content:  WDL  Suicidal Thoughts:  No  Homicidal Thoughts:  No  Memory:  Immediate;   Fair Recent;   Fair Remote;   Fair  Judgement:  Impaired  Insight:  Shallow  Psychomotor Activity:  Normal  Concentration:  Concentration: Fair and Attention Span: Fair  Recall:  [employer] of Knowledge:  Fair  Language:  Fair  Akathisia:  No  Handed:  Right  AIMS (if indicated):     Assets:  Communication Skills Desire for Improvement Financial Resources/Insurance Housing Resilience Social Support  ADL's:  Intact  Cognition:  WNL  Sleep:        Have you used any form of tobacco in the last 30 days? (Cigarettes, Smokeless Tobacco, Cigars, and/or Pipes): No  Has this patient used any form of tobacco in the last 30 days? (Cigarettes, Smokeless Tobacco, Cigars, and/or Pipes) Yes, No  Blood Alcohol level:  Lab Results  Component Value Date   ETH <5 12/11/2016   ETH <5 12/10/2015    Metabolic Disorder Labs:  Lab Results  Component Value Date   HGBA1C 5.7 (H) 12/10/2015   MPG 117 12/10/2015   No results found for: PROLACTIN Lab Results  Component Value Date   CHOL 229 (H) 12/11/2015   TRIG 85 12/11/2015   HDL 46 12/11/2015   CHOLHDL 5.0 12/11/2015   VLDL 17 12/11/2015   LDLCALC 166 (H) 12/11/2015    See Psychiatric Specialty Exam and Suicide Risk Assessment completed by Attending Physician prior to discharge.  Discharge destination:  Home  Is patient on multiple antipsychotic therapies at discharge:  No   Has Patient had three or more failed trials of antipsychotic monotherapy by history:  No  Recommended Plan for Multiple Antipsychotic Therapies: NA  Discharge Instructions    Diet - low sodium heart healthy    Complete by:  As directed    Increase activity slowly    Complete by:  As directed       Allergies as of 12/14/2016      Reactions   Codeine Nausea And Vomiting   Geodon [ziprasidone Hcl] Nausea And Vomiting   Wellbutrin [bupropion] Other (See Comments)   Reaction:  Hallucinations   Dilaudid [hydromorphone Hcl] Nausea And Vomiting, Other (See Comments)   Reaction:  Hallucinations   Hydrocodone Nausea And Vomiting   Latex Hives   Tagamet [cimetidine] Other (See Comments)   Reaction:  Unknown  Medication List    STOP taking these medications   ALPRAZolam 1 MG tablet Commonly known as:  XANAX     TAKE these medications     Indication  amphetamine-dextroamphetamine 20 MG 24 hr capsule Commonly known as:  ADDERALL XR Take 40 mg by mouth daily.  Indication:  Attention Deficit Hyperactivity Disorder   citalopram 40 MG tablet Commonly known as:  CELEXA Take 1 tablet (40 mg total) by mouth daily.  Indication:  Depression        Follow-up recommendations:  Activity:  as tolerated. Diet:  low sodium heart healthy. Other:  keep follow up appointment.  Comments:    Signed: Kristine Linea, MD 12/14/2016, 9:37 AM

## 2016-12-14 NOTE — Progress Notes (Signed)
Patient admitted to unit after overdose on Xanax.  Patient irritable, agitated, and verbally aggressive on admission.  Patient oriented to unit and taken to her room.  Patient search performed.  No contraband found.

## 2016-12-14 NOTE — [facility] Suicide Risk Assessment (Signed)
[facility] Discharge Suicide Risk Assessment   Principal Problem: Adjustment disorder with mixed anxiety and depressed mood Discharge Diagnoses:  Patient Active Problem List   Diagnosis Date Noted  . Major depressive disorder, recurrent severe without psychotic features (HCC) [F33.2] 12/13/2016  . Intentional benzodiazepine overdose (HCC) [T42.4X2A] 12/13/2016  . Adjustment disorder with mixed anxiety and depressed mood [F43.23] 12/13/2016  . Weakness [R53.1] 12/10/2015  . Anxiety [F41.9] 12/10/2015  . Hypothyroidism [E03.9] 12/10/2015  . Chronic pain [G89.29] 12/10/2015  . Weakness of right side of body [R53.1] 12/10/2015  . Emotional crisis, acute reaction to stress [F43.0]   . Crohn's disease (HCC) [K50.90] 12/13/2013  . Depression [F32.9]     Total Time spent with patient: 1 hour  Musculoskeletal: Strength & Muscle Tone: within normal limits Gait & Station: normal Patient leans: N/A  Psychiatric Specialty Exam: Review of Systems  Psychiatric/Behavioral: The patient is nervous/anxious.   All other systems reviewed and are negative.   Blood pressure (!) 110/93, pulse 72, temperature 98.2 F (36.8 C), temperature source Oral, resp. rate 19, height 5\' 1"  (1.549 m), weight 73.5 kg (162 lb), SpO2 100 %.Body mass index is 30.61 kg/m.  General Appearance: Casual  Eye Contact::  Good  Speech:  Clear and Coherent409  Volume:  Normal  Mood:  Anxious  Affect:  Appropriate  Thought Process:  Goal Directed and Descriptions of Associations: Intact  Orientation:  Full (Time, Place, and Person)  Thought Content:  WDL  Suicidal Thoughts:  No  Homicidal Thoughts:  No  Memory:  Immediate;   Fair Recent;   Fair Remote;   Fair  Judgement:  Impaired  Insight:  Shallow  Psychomotor Activity:  Normal  Concentration:  Fair  Recall:  [employer] of Knowledge:Fair  Language: Fair  Akathisia:  No  Handed:  Right  AIMS (if indicated):     Assets:  Communication Skills Desire for  Improvement Financial Resources/Insurance Housing Resilience Social Support  Sleep:     Cognition: WNL  ADL's:  Intact   Mental Status Per Nursing Assessment::   On Admission:  NA  Demographic Factors:  Caucasian, Low socioeconomic status and Unemployed  Loss Factors: Loss of significant relationship and Financial problems/change in socioeconomic status  Historical Factors: Family history of mental illness or substance abuse and Impulsivity  Risk Reduction Factors:   Responsible for children under 98 years of age, Sense of responsibility to family, Living with another person, especially a relative, Positive social support and Positive therapeutic relationship  Continued Clinical Symptoms:  Depression:   Impulsivity Medical Diagnoses and Treatments/Surgeries  Cognitive Features That Contribute To Risk:  None    Suicide Risk:  Minimal: No identifiable suicidal ideation.  Patients presenting with no risk factors but with morbid ruminations; may be classified as minimal risk based on the severity of the depressive symptoms    Plan Of Care/Follow-up recommendations:  Activity:  as tolerated. Diet:  low sodium heart healthy. Other:  keep follow up appointments.  Kristine Linea, MD 12/14/2016, 9:34 AM

## 2016-12-14 NOTE — H&P (Signed)
Psychiatric Admission Assessment Adult  Patient Identification: Nichole Aguilar MRN:  [id number] Date of Evaluation:  12/14/2016 Chief Complaint:  Depression Principal Diagnosis: Adjustment disorder with mixed anxiety and depressed mood Diagnosis:   Patient Active Problem List   Diagnosis Date Noted  . Major depressive disorder, recurrent severe without psychotic features (HCC) [F33.2] 12/13/2016  . Intentional benzodiazepine overdose (HCC) [T42.4X2A] 12/13/2016  . Adjustment disorder with mixed anxiety and depressed mood [F43.23] 12/13/2016  . Weakness [R53.1] 12/10/2015  . Anxiety [F41.9] 12/10/2015  . Hypothyroidism [E03.9] 12/10/2015  . Chronic pain [G89.29] 12/10/2015  . Weakness of right side of body [R53.1] 12/10/2015  . Emotional crisis, acute reaction to stress [F43.0]   . Crohn's disease (HCC) [K50.90] 12/13/2013  . Depression [F32.9]    History of Present Illness:   Identifying data. Nichole Aguilar is a 46 year old female with history of depression and anxiety.  Chief complaint. "It was stupid."  History of present illness. Information was obtained from the patient and the chart. The patient was brought to the hospital after she overdosed on her current form of Xanax from her husband during marital argument. According to the chart, the husband wants to divorce her but the patient refuses. The patient minimizes marital problems, stress is that they've been married for 26 years and this is not the first time they had difficulties. They've been to marriage counseling before. The patient adamantly denies any thoughts intentions or plans to hurt herself or others. She denies feeling suicidal at the time of overdose which was on Saturday 3 days ago. She is embarrassed and feels that it was stupid. She notes that she should have walked away from the situation she reports that she has been off her Celexa for several days as she did not pick it up from the pharmacy. The patient reports that while  on Celexa, Adderall, and Xanax prescribed for years by her primary care provider she does not experience any symptoms of depression. She takes Xanax only as needed. Tthat's why she had a handful of at home. She denies ever having psychotic symptoms or symptoms suggestive of bipolar mania. She does endorse infrequent panic attacks, social anxiety, PTSD symptoms with nightmares and flashbacks. She denies OCD symptoms. She denies alcohol or illicit substance use. She has been abusing Xanax or Adderall.   Past psychiatric history. The patient reports that she was hospitalized once many years ago. She denies ever attempting suicide. She has been on Celexa for over 10 years. She believes that her current regimen is the best.  in the past and the patient underwent psychotherapy in couples therapy but is no longer interested in therapy. She she underwent extensive psychological testing that identified depression and PTSD as major problems. Somehow she did not mention diagnosis of ADHD for which she is prescribed stimulants. 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 Interestingly the patient is allergic to Geodon. She does not remember ever being prescribed it. I would assume that it was given in the emergency room for agitation.   Family psychiatric history. Daughter with depression.  Social history. She lives with her husband of 26 years. She does not work outside the house. She has a history of medical problems including strokes. She has a sad and to detect elevated blood pressure that she has found extremely helpful for anxiety also. She has 4 children 3 of them are teenagers. At the patient reports that over the years there were multiple marital crisis that were sometimes  addressed with couples therapy.  Total Time spent with patient: 1 hour  Is the patient at risk to self? No.  Has the patient been a risk to self in the past 6 months? No.  Has the patient been a risk to self  within the distant past? No.  Is the patient a risk to others? No.  Has the patient been a risk to others in the past 6 months? No.  Has the patient been a risk to others within the distant past? No.   Prior Inpatient Therapy:   Prior Outpatient Therapy:    Alcohol Screening: 1. How often do you have a drink containing alcohol?: Never 9. Have you or someone else been injured as a result of your drinking?: No 10. Has a relative or friend or a doctor or another health worker been concerned about your drinking or suggested you cut down?: No Alcohol Use Disorder Identification Test Final Score (AUDIT): 0 Brief Intervention: AUDIT score less than 7 or less-screening does not suggest unhealthy drinking-brief intervention not indicated Substance Abuse History in the last 12 months:  No. Consequences of Substance Abuse: NA Previous Psychotropic Medications: Yes  Psychological Evaluations: No  Past Medical History:  Past Medical History:  Diagnosis Date  . Anxiety   . Crohn's disease (HCC) 2002  . Crohn's disease (HCC) 12/13/2013  . Depression   . Dyspareunia   . Fibromyalgia 2000  . Hypothyroidism 12/10/2015  . Lupus   . Stroke [facility]) 2012   left sided weakness    Past Surgical History:  Procedure Laterality Date  . ABDOMINAL HYSTERECTOMY    . ANKLE SURGERY    . CHOLECYSTECTOMY  1997   laparoscopic  . ELBOW SURGERY    . HAND SURGERY    . TOTAL ABDOMINAL HYSTERECTOMY W/ BILATERAL SALPINGOOPHORECTOMY  2008   recurrent ovarian cysts   Family History:  Family History  Problem Relation Age of Onset  . Hodgkin's lymphoma Mother   . Stroke Mother   . Hypertension Father   . Diabetes type II Father   . Cancer Father   . Breast cancer Maternal Aunt   . Osteoporosis Unknown        Paternal side   Tobacco Screening: Have you used any form of tobacco in the last 30 days? (Cigarettes, Smokeless Tobacco, Cigars, and/or Pipes): No Social History:  History  Alcohol Use No     History   Drug Use No    Comment: UDS + for Amphetamines and Benzos    Additional Social History:                           Allergies:   Allergies  Allergen Reactions  . Codeine Nausea And Vomiting  . Geodon [Ziprasidone Hcl] Nausea And Vomiting  . Wellbutrin [Bupropion] Other (See Comments)    Reaction:  Hallucinations  . Dilaudid [Hydromorphone Hcl] Nausea And Vomiting and Other (See Comments)    Reaction:  Hallucinations  . Hydrocodone Nausea And Vomiting  . Latex Hives  . Tagamet [Cimetidine] Other (See Comments)    Reaction:  Unknown    Lab Results: No results found for this or any previous visit (from the past 48 hour(s)).  Blood Alcohol level:  Lab Results  Component Value Date   [facility] <5 12/11/2016   ETH <5 12/10/2015    Metabolic Disorder Labs:  Lab Results  Component Value Date   HGBA1C 5.7 (H) 12/10/2015   MPG 117 12/10/2015  No results found for: PROLACTIN Lab Results  Component Value Date   CHOL 229 (H) 12/11/2015   TRIG 85 12/11/2015   HDL 46 12/11/2015   CHOLHDL 5.0 12/11/2015   VLDL 17 12/11/2015   LDLCALC 166 (H) 12/11/2015    Current Medications: Current Facility-Administered Medications  Medication Dose Route Frequency Provider Last Rate Last Dose  . acetaminophen (TYLENOL) tablet 650 mg  650 mg Oral Q6H PRN Breah Joa B, MD      . alum & mag hydroxide-simeth (MAALOX/MYLANTA) 200-200-20 MG/5ML suspension 30 mL  30 mL Oral Q4H PRN Cece Milhouse B, MD      . citalopram (CELEXA) tablet 40 mg  40 mg Oral Daily Jacques Willingham B, MD      . hydrOXYzine (ATARAX/VISTARIL) tablet 50 mg  50 mg Oral TID PRN Brazos Sandoval B, MD      . magnesium hydroxide (MILK OF MAGNESIA) suspension 30 mL  30 mL Oral Daily PRN Dashton Czerwinski B, MD      . traZODone (DESYREL) tablet 100 mg  100 mg Oral QHS Nakeya Adinolfi B, MD       PTA Medications: Prescriptions Prior to Admission  Medication Sig Dispense Refill Last Dose  .  ALPRAZolam (XANAX) 1 MG tablet Take 1 mg by mouth 2 (two) times daily as needed for anxiety.    Past Week at Unknown time  . amphetamine-dextroamphetamine (ADDERALL XR) 20 MG 24 hr capsule Take 40 mg by mouth daily.   Past Week at Unknown time  . citalopram (CELEXA) 40 MG tablet Take 40 mg by mouth daily.    Past Week at Unknown time    Musculoskeletal: Strength & Muscle Tone: within normal limits Gait & Station: normal Patient leans: N/A  Psychiatric Specialty Exam: Physical Exam  Nursing note and vitals reviewed. Constitutional: She is oriented to person, place, and time. She appears well-developed and well-nourished.  HENT:  Head: Normocephalic and atraumatic.  Eyes: Conjunctivae and EOM are normal. Pupils are equal, round, and reactive to light.  Neck: Normal range of motion. Neck supple.  Cardiovascular: Normal rate, regular rhythm and normal heart sounds.   Respiratory: Effort normal and breath sounds normal.  GI: Soft. Bowel sounds are normal.  Musculoskeletal: Normal range of motion.  Neurological: She is alert and oriented to person, place, and time.  Skin: Skin is warm and dry.  Psychiatric: She has a normal mood and affect. Her speech is normal and behavior is normal. Thought content normal. Cognition and memory are normal. She expresses impulsivity.    Review of Systems  Psychiatric/Behavioral: The patient is nervous/anxious.   All other systems reviewed and are negative.   Blood pressure (!) 110/93, pulse 72, temperature 98.2 F (36.8 C), temperature source Oral, resp. rate 19, height 5\' 1"  (1.549 m), weight 73.5 kg (162 lb), SpO2 100 %.Body mass index is 30.61 kg/m.  See SRA.                                                  Sleep:       Treatment Plan Summary: Daily contact with patient to assess and evaluate symptoms and progress in treatment and Medication management   Nichole Aguilar is a 46 year old female with a history of depression and  anxiety admitted after overdose on Xanax in the context of marital problems.  1. Suicidal  ideation. The patient adamantly denies any thoughts, intention or plans to hurt herself or others. She is able to contract for safety in the hospital.  2. Mood. We contiued Celexa for depression.  3. ADHD. We will not offer stimulants as they worsen anxiety.  4. Anxiety. We will not offer Xanax as the patient overdosed on it.  5. Disposition. She will be discharged to home with her husband. She will follow up with her primary provider.    Observation Level/Precautions:  15 minute checks  Laboratory:  CBC Chemistry Profile UDS UA  Psychotherapy:    Medications:    Consultations:    Discharge Concerns:    Estimated LOS:  Other:     Physician Treatment Plan for Primary Diagnosis: Adjustment disorder with mixed anxiety and depressed mood Long Term Goal(s): Improvement in symptoms so as ready for discharge  Short Term Goals: Ability to identify changes in lifestyle to reduce recurrence of condition will improve, Ability to verbalize feelings will improve, Ability to disclose and discuss suicidal ideas, Ability to demonstrate self-control will improve, Ability to identify and develop effective coping behaviors will improve, Compliance with prescribed medications will improve and Ability to identify triggers associated with substance abuse/mental health issues will improve  Physician Treatment Plan for Secondary Diagnosis: Principal Problem:   Adjustment disorder with mixed anxiety and depressed mood Active Problems:   Major depressive disorder, recurrent severe without psychotic features (HCC)   Intentional benzodiazepine overdose (HCC)  Long Term Goal(s): NA  Short Term Goals: NA  I certify that inpatient services furnished can reasonably be expected to improve the patient's condition.    Kristine Linea, MD 6/5/20189:10 AM

## 2016-12-14 NOTE — Progress Notes (Addendum)
  [facility] Adult Case Management Discharge Plan :  Will you be returning to the same living situation after discharge:  Yes,  with husband At discharge, do you have transportation home?: Yes,  husband Do you have the ability to pay for your medications: Yes,  [employer] insurance  Release of information consent forms completed and in the chart;  Patient's signature needed at discharge.  Patient to Follow up at: Follow-up Information    [facility]. Go on 12/21/2016.   Why:  Please attend your hospital follow up appointment on Tuesday, 12/21/16, at 2:40pm. Please bring a copy of your hospital discharge paperwork. Per chart note from CSW Wierda, patient declined psychiatric follow up care and preferred to continue w PCP Contact information: [address] [place], [place] [address] P: [phone] F: [phone]          Next level of care provider has access to [facility] Link:no  Safety Planning and Suicide Prevention discussed: Yes,  with husband  Have you used any form of tobacco in the last 30 days? (Cigarettes, Smokeless Tobacco, Cigars, and/or Pipes): No  Has patient been referred to the Quitline?: N/A patient is not a smoker  Patient has been referred for addiction treatment: Pt. refused referral  Sallee Lange, LCSW 12/14/2016, 2:58 PM

## 2016-12-14 NOTE — Progress Notes (Signed)
Pt is declining any psychiatric follow up and requests to continue to receive medication management through her PCP provider at [facility] in [place]. Garner Nash, MSW, LCSW Clinical Social Worker 12/14/2016 10:42 AM

## 2018-01-31 ENCOUNTER — Ambulatory Visit: Admitting: Neurology

## 2018-02-02 ENCOUNTER — Telehealth: Payer: Self-pay | Admitting: Neurology

## 2018-02-02 NOTE — Telephone Encounter (Signed)
Called the pt from the wait list. Pt is schedule 8/6 for a new pt apt. We have had some openings come open next week in my sleep consult slots and Nichole Aguilar has informed me that I could use those slots if needed. Wanted to offer the pt a sooner apt. LVM for the pt to call back  When pt calls back if those slots are still available we can offer any opening for next wk.

## 2018-02-14 ENCOUNTER — Ambulatory Visit: Admitting: Neurology

## 2018-02-14 ENCOUNTER — Encounter

## 2018-02-15 ENCOUNTER — Encounter: Payer: Self-pay | Admitting: Neurology

## 2018-03-14 ENCOUNTER — Ambulatory Visit: Admitting: Neurology

## 2018-04-11 ENCOUNTER — Encounter: Payer: Self-pay | Admitting: Psychology

## 2018-04-11 ENCOUNTER — Ambulatory Visit (INDEPENDENT_AMBULATORY_CARE_PROVIDER_SITE_OTHER): Admitting: Neurology

## 2018-04-11 ENCOUNTER — Encounter: Payer: Self-pay | Admitting: Neurology

## 2018-04-11 VITALS — BP 128/81 | HR 76 | Ht 61.0 in | Wt 162.5 lb

## 2018-04-11 DIAGNOSIS — F419 Anxiety disorder, unspecified: Secondary | ICD-10-CM | POA: Diagnosis not present

## 2018-04-11 DIAGNOSIS — R4189 Other symptoms and signs involving cognitive functions and awareness: Secondary | ICD-10-CM

## 2018-04-11 DIAGNOSIS — F4323 Adjustment disorder with mixed anxiety and depressed mood: Secondary | ICD-10-CM

## 2018-04-11 DIAGNOSIS — F430 Acute stress reaction: Secondary | ICD-10-CM

## 2018-04-11 NOTE — Progress Notes (Signed)
02-09-2018 Called the pt from the wait list. Pt is schedule 8/6 for a new pt apt. We have had some openings come open next week in my sleep consult slots and [facility] has informed me that I could use those slots if needed. Wanted to offer the pt a sooner apt. LVM for the pt to call back. When pt calls back  while those slots are still available we can offer any opening for next wk.  RN [facility]    - SLEEP MEDICINE CLINIC   Provider:  Melvyn Novas, MD   Primary Care Physician:  Nichole Aguilar., PA-C   Referring Provider:  As above - referral for memory loss, in the setting of ADHD/ ADD.    Chief Complaint  Patient presents with  . New Patient (Initial Visit)    Room 10. Patient here with husband.     HPI:  Nichole Aguilar is a 47 y.o. female patient is seen here on 04-11-2018  in a referral  from PA- C [facility] for an evaluation of memory loss. The patient had years ago a sleep study here and was referred to [facility] for this new complaint. Her sleep study in 2015 had no evidence of organic sleep disorders, but she reported an Epworth Sleepiness Score of 18 points with Insomnia .  She had meanwhile been told she had 2 minor "strokes" in 2012 in [place]- . She had one more recent admission for a paralysis spell, ER at [facility] 11/2017 could not find a stroke and no other organic cause, MRI/ MRA brain  report was reviewed and is attached .   Mrs. Nichole Aguilar is especially concerned that there is a strong family history of frontal lobe dementia, Pick's disease. Her mother developed symptoms in 1997-2000, she was born in 19. She dies at age 52 in 21.   Chief complaint according to patient : Sometimes the patient cannot remember parts of the conversation, sometimes she cannot remember that the conversation took place at all.  She gave me one anecdote and she asked her son repeatedly for the name of the movie they were watching and each time repeated the question also he had answered her questions. She has  sometimes word finding difficulties, difficulties with coming up with the name, she also reports that sometimes when she drives she is more or less on autopilot-she has gotten lost a couple of times .  Medical and psychiatric history and family history: Mother had Picks disease and maternal uncle with frontal Pick's type dementia.  Father not affected, but paternal uncle has memory loss.   Social history:  Married. 4 children , the youngest are 11 and 51 years old, non smoker, rarely drinking alcohol- 2 a month.  Caffeine ; one soda a day.    Review of Systems: Out of a complete 14 system review, the patient complains of only the following symptoms, and all other reviewed systems are negative.   Forgetfulness. Distractability. Sometimes taking naps. Weight gain , unintentionally- may be due to [facility]?  Epworth Sleepiness score is elevated but not pathologically at  12/ 24 points , high Fatigue severity score 40/63 points.    depression score - she reports being situational depressed,  positive.    Social History   Socioeconomic History  . Marital status: Married    Spouse name: Nichole Aguilar  . Number of children: 4  . Years of education: College  . Highest education level: Not on file  Occupational History  . Not on  file  Social Needs  . Financial resource strain: Not on file  . Food insecurity:    Worry: Not on file    Inability: Not on file  . Transportation needs:    Medical: Not on file    Non-medical: Not on file  Tobacco Use  . Smoking status: Former Smoker    Last attempt to quit: 07/12/2010    Years since quitting: 7.7  . Smokeless tobacco: Never Used  Substance and Sexual Activity  . Alcohol use: No  . Drug use: No    Comment: UDS + for Amphetamines and Benzos  . Sexual activity: Yes  Lifestyle  . Physical activity:    Days per week: Not on file    Minutes per session: Not on file  . Stress: Not on file  Relationships  . Social connections:    Talks on phone: Not  on file    Gets together: Not on file    Attends religious service: Not on file    Active member of club or organization: Not on file    Attends meetings of clubs or organizations: Not on file    Relationship status: Not on file  . Intimate partner violence:    Fear of current or ex partner: Not on file    Emotionally abused: Not on file    Physically abused: Not on file    Forced sexual activity: Not on file  Other Topics Concern  . Not on file  Social History Narrative   Patient is married Nichole Aguilar) and lives at home with her husband and three children.   Patient has four children.   Patient has a college education.   Patient drinks one cup of coffee daily and one soda daily.    Family History  Problem Relation Age of Onset  . Hodgkin's lymphoma Mother   . Stroke Mother   . Hypertension Father   . Diabetes type II Father   . Cancer Father   . Breast cancer Maternal Aunt   . Osteoporosis Unknown        Paternal side      Past Surgical History:  Procedure Laterality Date  . ABDOMINAL HYSTERECTOMY    . ANKLE SURGERY    . CHOLECYSTECTOMY  1997   laparoscopic  . ELBOW SURGERY    . HAND SURGERY    . TOTAL ABDOMINAL HYSTERECTOMY W/ BILATERAL SALPINGOOPHORECTOMY  2008   recurrent ovarian cysts    Current Outpatient Medications  Medication Sig Dispense Refill  . citalopram ([facility]) 40 MG tablet Take 1 tablet by mouth daily.    Marland Kitchen lisdexamfetamine (VYVANSE) 40 MG capsule Take 40 mg by mouth every morning.     No current facility-administered medications for this visit.     Allergies as of 04/11/2018 - Review Complete 04/11/2018  Allergen Reaction Noted  . Codeine Nausea And Vomiting 03/24/2012  . Geodon [ziprasidone hcl] Nausea And Vomiting 03/24/2012  . Wellbutrin [bupropion] Other (See Comments) 05/15/2014  . Dilaudid [hydromorphone hcl] Nausea And Vomiting and Other (See Comments) 12/10/2015  . Hydrocodone Nausea And Vomiting 12/10/2015  . Latex Hives 03/24/2012   . Tagamet [cimetidine] Other (See Comments) 11/02/2013  CLINICAL DATA:  Right-sided weakness and slurred speech.  EXAM: MRA HEAD WITHOUT CONTRAST  TECHNIQUE: Angiographic images of the Circle of Willis were obtained using MRA technique without intravenous contrast.  COMPARISON:  MRI brain from the same day.  CTA head and neck.  FINDINGS: Internal carotid arteries demonstrate moderate tortuosity in the high  cervical segments without focal stenosis. There are otherwise within normal limits through the ICA termini bilaterally. The A1 and M1 segments are normal. The anterior communicating artery is patent. The MCA bifurcations are intact bilaterally. There is mild attenuation of distal MCA branch vessels bilaterally.  The vertebral arteries are codominant. The PICA origins are visualized and normal. The basilar artery is within normal limits. The left posterior cerebral artery originates from the basilar tip. The right posterior cerebral artery is of fetal type. A small right P1 segment is intact.  IMPRESSION: 1. Mild distal small vessel disease is again seen. 2. No significant proximal stenosis, aneurysm, or branch vessel occlusion. 3. Tortuosity of the high cervical internal carotid arteries is most commonly seen in the setting of chronic hypertension.   Electronically Signed   By: Marin Roberts M.D.   On: 12/10/2015 19:42 CLINICAL DATA:  New onset right-sided weakness and slurred speech. Personal history of stroke.  EXAM: MRI HEAD WITHOUT CONTRAST  TECHNIQUE: Multiplanar, multiecho pulse sequences of the brain and surrounding structures were obtained without intravenous contrast.  COMPARISON:  CTA head and neck from the same day. MRI brain 12/25/2012 and 02/23/2013.  FINDINGS: No acute infarct, hemorrhage, or mass lesion is present. The ventricles are of normal size. No significant extraaxial fluid collection is present.  The study is mildly  degraded by patient motion. 3 or 4 scattered subcortical T2 hyperintensities are stable, within normal limits for age.  The internal auditory canals are within normal limits bilaterally. The brainstem and cerebellum are normal.  Flow is present in the major intracranial arteries. The globes and orbits are intact. The paranasal sinuses and the mastoid air cells are clear. The skullbase is within normal limits. Midline sagittal images are unremarkable.  IMPRESSION: Normal MRI of the brain for age. No acute or focal lesion to explain right-sided weakness or slurred speech.   Electronically Signed   By: Marin Roberts M.D.   On: 12/10/2015 19:40  Vitals: BP 128/81   Pulse 76   Ht 5\' 1"  (1.549 m)   Wt 162 lb 8 oz (73.7 kg)   BMI 30.70 kg/m  Last Weight:  Wt Readings from Last 1 Encounters:  04/11/18 162 lb 8 oz (73.7 kg)   ZOX:WRUE mass index is 30.7 kg/m.     Last Height:   Ht Readings from Last 1 Encounters:  04/11/18 5\' 1"  (1.549 m)    Physical exam:  General: The patient is awake, alert and appears not in acute distress. The patient is tattooed.  Head: Normocephalic, atraumatic. Neck is supple. Mallampati  1,  neck circumference:13" . Nasal airflow patent , .Retrognathia is seen. Bruxism. Cardiovascular:  Regular rate and rhythm, without  murmurs or carotid bruit, and without distended neck veins. Respiratory: Lungs are clear to auscultation. Skin:  Without evidence of edema, or rash Trunk: BMI is 30.7- weight gain over the last 12 month about 30 pounds.  The patient's posture is erect.   Neurologic exam : The patient is awake and alert, oriented to place and time.   Memory subjective  described as impaired. Memory testing at PCP revealed MMSE at 29/30, here 27/30 . Needs MOCA.    Montreal Cognitive Assessment  04/11/2018  Visuospatial/ Executive (0/5) 4  Naming (0/3) 3  Attention: Read list of digits (0/2) 2  Attention: Read list of letters (0/1) 1   Attention: Serial 7 subtraction starting at 100 (0/3) 3  Language: Repeat phrase (0/2) 1  Language : Fluency (0/1) 1  Abstraction (0/2) 2  Delayed Recall (0/5) 1  Orientation (0/6) 6  Total 24  Adjusted Score (based on education) 24   Only remembered 1 out of 5 words- and one word she couldn't name was " Toney Reil" the name of her sons dog !!!!  MMSE: MMSE - Mini Mental State Exam 04/11/2018  Orientation to time 5  Orientation to Place 5  Registration 3  Attention/ Calculation 5  Recall 1  Language- name 2 objects 2  Language- repeat 1  Language- follow 3 step command 3  Language- read & follow direction 1  Write a sentence 0  Copy design 1  Total score 27    Attention span & concentration ability appears limited. Speech is fluent- Here without dysarthria, dysphonia or aphasia.  Mood and affect are worried, suspicious, sceptic.   Cranial nerves: Pupils are round, briskly reactive to light. Left eye squinted, not enophthalmos, not ptosis. - and pupils are equal.  Funduscopic exam without  evidence of pallor or edema. Extraocular movements in vertical and horizontal planes intact and without nystagmus. Visual fields by finger perimetry are intact. Hearing to finger rub intact. Facial sensation intact .  Facial motor strength is symmetric and tongue and uvula move midline. Wide open airway,  Shoulder shrug issymmetrical.   Motor exam:   Normal tone, muscle bulk and symmetric strength in all extremities. Sensory:  Fine touch, pinprick and vibration were tested in all extremities. Proprioception tested in the upper extremities as normal. Coordination: Finger-to-nose maneuver normal without evidence of ataxia, dysmetria or tremor. No pronation.  Gait and station: Patient walks without assistive device and is able unassisted to climb up to the exam table. Strength within normal limits. Stance is stable and normal. Turns fluently with 3 Steps.  Walks on her tip toes. Romberg testing is  negative. Deep tendon reflexes: in the upper and lower extremities are symmetric and intact.   Assessment:  After physical and neurologic examination, review of laboratory studies,  Personal review of imaging studies, reports of other /same  Imaging studies, results of polysomnography and / or neurophysiology testing and pre-existing records as far as provided in visit., my assessment is :  1) May have now some snoring and apnea, after having gained weight. Only snores some times - no apnea observed. Offered Check by HST. No need to repeat admission for a sleep study.   2) memory or ADD? Had normal MMSE and MOCA- refer to neuropsychological testing battery. No evidence of spells . No loss of sensory and no balance problems.    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 Continue Psychological care. No repeat MRI needed.    The patient was advised of the nature of the diagnosed disorder , the treatment options and the  risks for general health and wellness arising from not treating the condition.   I spent more than 45 minutes of face to face time with the patient. Greater than 50% of time was spent in counseling and coordination of care. We have discussed the diagnosis and differential and I answered the patient's questions.      Melvyn Novas, MD 04/11/2018, 1:47 PM  Certified in Neurology by ABPN Certified in Sleep Medicine by [facility] [address] [place], [place] [address]

## 2018-09-19 ENCOUNTER — Encounter

## 2018-09-19 ENCOUNTER — Encounter: Payer: Self-pay | Admitting: Psychology

## 2019-05-25 ENCOUNTER — Other Ambulatory Visit: Payer: Self-pay

## 2019-05-25 DIAGNOSIS — Z20822 Contact with and (suspected) exposure to covid-19: Secondary | ICD-10-CM

## 2019-05-28 LAB — NOVEL CORONAVIRUS, NAA: SARS-CoV-2, NAA: NOT DETECTED

## 2020-04-03 ENCOUNTER — Emergency Department (HOSPITAL_COMMUNITY)

## 2020-04-03 ENCOUNTER — Emergency Department (HOSPITAL_COMMUNITY)
Admission: EM | Admit: 2020-04-03 | Discharge: 2020-04-03 | Disposition: A | Discharge status: Home / Self Care | Attending: Emergency Medicine | Admitting: Emergency Medicine

## 2020-04-03 ENCOUNTER — Encounter (HOSPITAL_COMMUNITY): Payer: Self-pay | Admitting: *Deleted

## 2020-04-03 ENCOUNTER — Other Ambulatory Visit: Payer: Self-pay

## 2020-04-03 DIAGNOSIS — E039 Hypothyroidism, unspecified: Secondary | ICD-10-CM | POA: Insufficient documentation

## 2020-04-03 DIAGNOSIS — Z87891 Personal history of nicotine dependence: Secondary | ICD-10-CM | POA: Diagnosis not present

## 2020-04-03 DIAGNOSIS — Z91040 Latex allergy status: Secondary | ICD-10-CM | POA: Diagnosis not present

## 2020-04-03 DIAGNOSIS — U071 COVID-19: Secondary | ICD-10-CM | POA: Diagnosis not present

## 2020-04-03 DIAGNOSIS — R509 Fever, unspecified: Secondary | ICD-10-CM | POA: Diagnosis present

## 2020-04-03 LAB — RESPIRATORY PANEL BY RT PCR (FLU A&B, COVID)
Influenza A by PCR: NEGATIVE
Influenza B by PCR: NEGATIVE
SARS Coronavirus 2 by RT PCR: POSITIVE — AB

## 2020-04-03 LAB — CBG MONITORING, ED
Glucose-Capillary: 64 mg/dL — ABNORMAL LOW (ref 70–99)
Glucose-Capillary: 96 mg/dL (ref 70–99)

## 2020-04-03 NOTE — Discharge Instructions (Signed)
You have been seen here for general aches, fever and chills. Lab work and imaging look reassuring, you are Covid positive. You must self quarantine for the next 10 days since symptom onset. I recommend over-the-counter pain medications like ibuprofen for general malaise, Tylenol for fever control, and Claritin to help with your nasal decongestion. Please stay hydrated, if you have no appetite I recommend consuming soup as this will provide you fluid as well some calories.  I have given you the contact information for "post Covid care" please contact them as they can provide with more information and how to manage your Covid symptoms. I have also spoken with the infusion clinic they will contact you with the next few days to schedule your infusion treatment.  I want you to come back to the emergency department if you develop severe chest pain, shortness of breath, uncontrolled nausea, vomiting, diarrhea as these symptoms acquire further evaluation management.

## 2020-04-03 NOTE — ED Notes (Signed)
Pt believed her bs was low so nurse checked it. BS 64. Snacks and drinks given to pt.

## 2020-04-03 NOTE — ED Provider Notes (Signed)
[facility] EMERGENCY DEPARTMENT Provider Note   CSN: [id number] Arrival date & time: 04/03/20  1320     History Chief Complaint  Patient presents with   Covid Exposure    Nichole Aguilar is a 49 y.o. female.  HPI   Patient with significant medical history of Crohn's disease, hypothyroidism, lupus presents to the emergency department with chief complaint of fevers, chills, cough, general body aches since Thursday of last week. Patient states she recently came in contact with someone who is Covid positive. She is not Covid vaccinated. She endorses diarrhea without seeing any blood in it, denies abdominal pain, nausea, vomiting, dysuria, shortness of breath, leg swelling, leg pain, history of DVTs or PEs not on hormone therapy. Patient has been taking Tylenol without any real relief. Patient denies headaches, sore throat, chest pain, shortness of breath, abdominal pain, nausea, vomiting, pedal edema.  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   Intentional benzodiazepine overdose (HCC) 12/13/2016   Adjustment disorder with mixed anxiety and depressed mood 12/13/2016   Weakness 12/10/2015   Anxiety 12/10/2015   Hypothyroidism 12/10/2015   Chronic pain 12/10/2015   Weakness of right side of body 12/10/2015   Emotional crisis, acute reaction to stress    Crohn's disease (HCC) 12/13/2013   Depression     Past Surgical History:  Procedure Laterality Date   ABDOMINAL HYSTERECTOMY     ANKLE SURGERY     CHOLECYSTECTOMY  1997   laparoscopic   ELBOW SURGERY     HAND SURGERY      TOTAL ABDOMINAL HYSTERECTOMY W/ BILATERAL SALPINGOOPHORECTOMY  2008   recurrent ovarian cysts     OB History    Gravida  4   Para  4   Term  4   Preterm      AB      Living  4     SAB      TAB      Ectopic      Multiple      Live Births              Family History  Problem Relation Age of Onset   Hodgkin's lymphoma Mother    Stroke Mother    Hypertension Father    Diabetes type II Father    Cancer Father    Breast cancer Maternal Aunt    Osteoporosis Other        Paternal side    Social History   Tobacco Use   Smoking status: Former Smoker    Quit date: 07/12/2010    Years since quitting: 9.7   Smokeless tobacco: Never Used  Substance Use Topics   Alcohol use: No   Drug use: No    Comment: UDS + for Amphetamines and Benzos    Home Medications Prior to Admission medications   Medication Sig Start Date End Date Taking? Authorizing Provider  citalopram (CELEXA) 40 MG tablet Take 1 tablet by mouth daily. 12/08/17   [provider]  lisdexamfetamine (VYVANSE) 40 MG capsule Take 40 mg by  mouth every morning.    [provider]    Allergies    Codeine, Geodon [ziprasidone hcl], Wellbutrin [bupropion], Dilaudid [hydromorphone hcl], Hydrocodone, Latex, and Tagamet [cimetidine]  Review of Systems   Review of Systems  Constitutional: Positive for chills and fever.  HENT: Positive for congestion. Negative for sore throat.   Eyes: Negative for visual disturbance.  Respiratory: Positive for cough. Negative for shortness of breath.   Cardiovascular: Negative for chest pain and palpitations.  Gastrointestinal: Positive for diarrhea. Negative for abdominal pain, nausea and vomiting.  Genitourinary: Negative for dyspareunia, dysuria, enuresis, flank pain and vaginal bleeding.  Musculoskeletal: Negative for back pain.  Skin: Negative for rash.  Neurological: Negative for dizziness and headaches.  Hematological: Does not  bruise/bleed easily.    Physical Exam Updated Vital Signs BP 113/78    Pulse 90    Temp 99.9 F (37.7 C) (Oral)    Resp 20    SpO2 97%   Physical Exam Vitals and nursing note reviewed.  Constitutional:      General: She is not in acute distress.    Appearance: She is not ill-appearing.  HENT:     Head: Normocephalic and atraumatic.     Right Ear: Tympanic membrane, ear canal and external ear normal.     Left Ear: Tympanic membrane, ear canal and external ear normal.     Nose: No congestion.     Mouth/Throat:     Mouth: Mucous membranes are moist.     Pharynx: Oropharynx is clear. No oropharyngeal exudate or posterior oropharyngeal erythema.  Eyes:     General: No scleral icterus. Cardiovascular:     Rate and Rhythm: Normal rate and regular rhythm.     Pulses: Normal pulses.     Heart sounds: No murmur heard.  No friction rub. No gallop.   Pulmonary:     Effort: No respiratory distress.     Breath sounds: No wheezing, rhonchi or rales.  Abdominal:     General: There is no distension.     Palpations: Abdomen is soft.     Tenderness: There is no abdominal tenderness. There is no right CVA tenderness, left CVA tenderness or guarding.  Musculoskeletal:        General: No swelling or tenderness.     Right lower leg: No edema.     Left lower leg: No edema.  Skin:    General: Skin is warm and dry.     Capillary Refill: Capillary refill takes less than 2 seconds.     Findings: No rash.  Neurological:     General: No focal deficit present.     Mental Status: She is alert.  Psychiatric:        Mood and Affect: Mood normal.     ED Results / Procedures / Treatments   Labs (all labs ordered are listed, but only abnormal results are displayed) Labs Reviewed  RESPIRATORY PANEL BY RT PCR (FLU A&B, COVID) - Abnormal; Notable for the following components:      Result Value   SARS Coronavirus 2 by RT PCR POSITIVE (*)    All other components within normal limits  CBG MONITORING,  ED - Abnormal; Notable for the following components:   Glucose-Capillary 64 (*)    All other components within normal limits  CBG MONITORING, ED    EKG None  Radiology DG Chest Port 1 View  Result Date: 04/03/2020 CLINICAL DATA:  Cough and body aches for 1 week EXAM: PORTABLE CHEST  1 VIEW COMPARISON:  None. FINDINGS: Single frontal view of the chest demonstrates scattered bilateral areas of interstitial prominence, with patchy consolidation at the lung bases greatest on the right. No effusion or pneumothorax. No acute bony abnormalities. IMPRESSION: 1. Interstitial prominence with bibasilar ground-glass consolidation compatible with multifocal pneumonia. Pattern is typical for COVID-19. Electronically Signed   By: Sharlet Salina M.D.   On: 04/03/2020 16:46    Procedures Procedures (including critical care time)  Medications Ordered in ED Medications - No data to display  ED Course  I have reviewed the triage vital signs and the nursing notes.  Pertinent labs & imaging results that were available during my care of the patient were reviewed by me and considered in my medical decision making (see chart for details).    MDM Rules/Calculators/A&P                          I have personally reviewed all imaging, labs and have interpreted them.  Patient presents to the emergency department with chief complaint of fever, chills, cough, general malaise since Thursday of last week. Patient was not in acute distress, vital signs reassuring. Will order respiratory panel as well as chest x-ray further evaluation.    Patient is Covid positive.  Chest x-ray consistent with atypical Covid pneumonia.  I have low suspicion patient would require hospitalization for Covid as she has no new oxygen requirements, no signs of respiratory failure on exam, she has little risk factors, vital signs reassuring. I have low suspicion for PE as patient is PERC negative, history more consistent with a viral URI  as she complains of general malaise, sore throat, cough, congestion. Low suspicion for acute cardiac abnormality as she has little risk factors, no signs of hypoperfusion or fluid overloaded noted on exam. Low suspicion for acute abdomen requiring surgical intervention as patient denies abdominal pain, tolerating p.o. without difficulty, no acute abdomen on exam. My suspicion patient suffering from Covid. Will recommend self quarantining for 10 days starting on symptom onset, refer to infusion clinic as she has a BMI of 31, provide her with post Covid care for further management.  Patient appears to be resting comfortably, vital signs reassuring, no indication for hospital admission. Patient is given at home care and strict return precautions. Patient verbalized that she understood and agreed to plan. Final Clinical Impression(s) / ED Diagnoses Final diagnoses:  COVID-19 virus infection    Rx / DC Orders ED Discharge Orders    None       Carroll Sage, PA-C 04/03/20 1807    Bethann Berkshire, MD 04/06/20 [id number]

## 2020-04-03 NOTE — ED Triage Notes (Signed)
Cough, body aches x 6-10 days

## 2020-04-04 ENCOUNTER — Telehealth: Payer: Self-pay | Admitting: Hospice and Palliative Medicine

## 2020-04-04 NOTE — Telephone Encounter (Signed)
I called to discuss with patient about Covid-19 symptoms and the use of regeneron, a monoclonal antibody infusion for those with mild to moderate Covid-19 symptoms and at a high risk of hospitalization.     Pt is qualified for this infusion at the [facility] due to co-morbid conditions and/or a member of an at-risk group.     Unable to reach pt. Left message to return call  Griselda Bramblett, PhD, NP-C [phone] (Infusion Center Hotline)  

## 2020-04-05 ENCOUNTER — Other Ambulatory Visit: Payer: Self-pay | Admitting: Family

## 2020-04-05 ENCOUNTER — Ambulatory Visit (HOSPITAL_COMMUNITY)
Admission: RE | Admit: 2020-04-05 | Discharge: 2020-04-05 | Disposition: A | Discharge status: Home / Self Care | Source: Ambulatory Visit | Attending: Pulmonary Disease | Admitting: Pulmonary Disease

## 2020-04-05 DIAGNOSIS — U071 COVID-19: Secondary | ICD-10-CM | POA: Diagnosis not present

## 2020-04-05 MED ORDER — DIPHENHYDRAMINE HCL 50 MG/ML IJ SOLN: 50.0000 mg | Freq: Once | INTRAMUSCULAR | Status: DC | PRN

## 2020-04-05 MED ORDER — ALBUTEROL SULFATE HFA 108 (90 BASE) MCG/ACT IN AERS: 2.0000 | INHALATION_SPRAY | Freq: Once | RESPIRATORY_TRACT | Status: DC | PRN

## 2020-04-05 MED ORDER — ACETAMINOPHEN 325 MG PO TABS
650.0000 mg | ORAL_TABLET | Freq: Once | ORAL | Status: AC
  Administered 2020-04-05: 650 mg via ORAL
  Filled 2020-04-05: qty 2

## 2020-04-05 MED ORDER — EPINEPHRINE 0.3 MG/0.3ML IJ SOAJ: 0.3000 mg | Freq: Once | INTRAMUSCULAR | Status: DC | PRN

## 2020-04-05 MED ORDER — FAMOTIDINE IN NACL 20-0.9 MG/50ML-% IV SOLN: 20.0000 mg | Freq: Once | INTRAVENOUS | Status: DC | PRN

## 2020-04-05 MED ORDER — SODIUM CHLORIDE 0.9 % IV SOLN: INTRAVENOUS | Status: DC | PRN

## 2020-04-05 MED ORDER — METHYLPREDNISOLONE SODIUM SUCC 125 MG IJ SOLR: 125.0000 mg | Freq: Once | INTRAMUSCULAR | Status: DC | PRN

## 2020-04-05 MED ORDER — SODIUM CHLORIDE 0.9 % IV SOLN
1200.0000 mg | Freq: Once | INTRAVENOUS | Status: AC
  Administered 2020-04-05: 17:00:00 1200 mg via INTRAVENOUS

## 2020-04-05 NOTE — Discharge Instructions (Signed)

## 2020-04-05 NOTE — Progress Notes (Signed)
  Diagnosis: COVID-19  Physician: Patrick Wright, MD  Procedure: Covid Infusion Clinic Med: casirivimab\imdevimab infusion - Provided patient with casirivimab\imdevimab fact sheet for patients, parents and caregivers prior to infusion.  Complications: No immediate complications noted.  Discharge: Discharged home   Nichole Aguilar 04/05/2020  

## 2020-04-05 NOTE — Telephone Encounter (Signed)
I connected by phone with Antonietta Breach on 04/05/2020 at 1:10 PM to discuss the potential use of a new treatment for mild to moderate COVID-19 viral infection in non-hospitalized patients.  This patient is a 49 y.o. female that meets the FDA criteria for Emergency Use Authorization of COVID monoclonal antibody casirivimab/imdevimab or bamlanivimab/eteseviamb.  Has a (+) direct SARS-CoV-2 viral test result  Has mild or moderate COVID-19   Is NOT hospitalized due to COVID-19  Is within 10 days of symptom onset  Has at least one of the high risk factor(s) for progression to severe COVID-19 and/or hospitalization as defined in EUA.  Specific high risk criteria : BMI > 25 and Immunosuppressive Disease or Treatment  I have spoken and communicated the following to the patient or parent/caregiver regarding COVID monoclonal antibody treatment:  1. FDA has authorized the emergency use for the treatment of mild to moderate COVID-19 in adults and pediatric patients with positive results of direct SARS-CoV-2 viral testing who are 68 years of age and older weighing at least 40 kg, and who are at high risk for progressing to severe COVID-19 and/or hospitalization.  2. The significant known and potential risks and benefits of COVID monoclonal antibody, and the extent to which such potential risks and benefits are unknown.  3. Information on available alternative treatments and the risks and benefits of those alternatives, including clinical trials.  4. Patients treated with COVID monoclonal antibody should continue to self-isolate and use infection control measures (e.g., wear mask, isolate, social distance, avoid sharing personal items, clean and disinfect "high touch" surfaces, and frequent handwashing) according to CDC guidelines.   5. The patient or parent/caregiver has the option to accept or refuse COVID monoclonal antibody treatment.  After reviewing this information with the patient, the patient has  agreed to receive one of the available covid 19 monoclonal antibodies and will be provided an appropriate fact sheet prior to infusion. Alver Sorrow, NP 04/05/2020 1:10 PM      Alver Sorrow, NP

## 2020-12-18 ENCOUNTER — Other Ambulatory Visit: Payer: Self-pay | Admitting: Orthopedic Surgery

## 2020-12-26 ENCOUNTER — Encounter (HOSPITAL_COMMUNITY): Payer: Self-pay | Admitting: Orthopedic Surgery

## 2020-12-26 ENCOUNTER — Other Ambulatory Visit: Payer: Self-pay

## 2020-12-26 NOTE — Progress Notes (Addendum)
Nichole Aguilar denies s/s of Covid in her  home and is not aware of exposure to Covid.  Nichole Aguilar had a stroke in 2012- patient reports that she has some memory issues at times.

## 2020-12-29 ENCOUNTER — Ambulatory Visit (HOSPITAL_COMMUNITY): Admitting: Certified Registered"

## 2020-12-29 ENCOUNTER — Other Ambulatory Visit: Payer: Self-pay

## 2020-12-29 ENCOUNTER — Ambulatory Visit (HOSPITAL_COMMUNITY)
Admission: RE | Admit: 2020-12-29 | Discharge: 2020-12-29 | Disposition: A | Discharge status: Home / Self Care | Attending: Orthopedic Surgery | Admitting: Orthopedic Surgery

## 2020-12-29 ENCOUNTER — Encounter (HOSPITAL_COMMUNITY): Payer: Self-pay | Admitting: Orthopedic Surgery

## 2020-12-29 ENCOUNTER — Encounter (HOSPITAL_COMMUNITY)
Admission: RE | Disposition: A | Discharge status: Home / Self Care | Payer: Self-pay | Source: Home / Self Care | Attending: Orthopedic Surgery

## 2020-12-29 DIAGNOSIS — Z888 Allergy status to other drugs, medicaments and biological substances status: Secondary | ICD-10-CM | POA: Diagnosis not present

## 2020-12-29 DIAGNOSIS — M65332 Trigger finger, left middle finger: Secondary | ICD-10-CM | POA: Diagnosis not present

## 2020-12-29 DIAGNOSIS — M65342 Trigger finger, left ring finger: Secondary | ICD-10-CM | POA: Insufficient documentation

## 2020-12-29 DIAGNOSIS — K5090 Crohn's disease, unspecified, without complications: Secondary | ICD-10-CM | POA: Insufficient documentation

## 2020-12-29 DIAGNOSIS — Z8262 Family history of osteoporosis: Secondary | ICD-10-CM | POA: Insufficient documentation

## 2020-12-29 DIAGNOSIS — Z833 Family history of diabetes mellitus: Secondary | ICD-10-CM | POA: Insufficient documentation

## 2020-12-29 DIAGNOSIS — Z91040 Latex allergy status: Secondary | ICD-10-CM | POA: Diagnosis not present

## 2020-12-29 DIAGNOSIS — Z8673 Personal history of transient ischemic attack (TIA), and cerebral infarction without residual deficits: Secondary | ICD-10-CM | POA: Insufficient documentation

## 2020-12-29 DIAGNOSIS — Z807 Family history of other malignant neoplasms of lymphoid, hematopoietic and related tissues: Secondary | ICD-10-CM | POA: Diagnosis not present

## 2020-12-29 DIAGNOSIS — Z885 Allergy status to narcotic agent status: Secondary | ICD-10-CM | POA: Insufficient documentation

## 2020-12-29 DIAGNOSIS — Z823 Family history of stroke: Secondary | ICD-10-CM | POA: Insufficient documentation

## 2020-12-29 DIAGNOSIS — M65322 Trigger finger, left index finger: Secondary | ICD-10-CM | POA: Diagnosis present

## 2020-12-29 DIAGNOSIS — Z8249 Family history of ischemic heart disease and other diseases of the circulatory system: Secondary | ICD-10-CM | POA: Insufficient documentation

## 2020-12-29 HISTORY — DX: Other complications of anesthesia, initial encounter: T88.59XA

## 2020-12-29 HISTORY — PX: TRIGGER FINGER RELEASE: SHX641

## 2020-12-29 HISTORY — DX: Unspecified osteoarthritis, unspecified site: M19.90

## 2020-12-29 SURGERY — RELEASE, A1 PULLEY, FOR TRIGGER FINGER
Anesthesia: General | Site: Hand | Laterality: Left

## 2020-12-29 MED ORDER — FENTANYL CITRATE (PF) 250 MCG/5ML IJ SOLN
INTRAMUSCULAR | Status: AC
  Filled 2020-12-29: qty 5

## 2020-12-29 MED ORDER — FENTANYL CITRATE (PF) 100 MCG/2ML IJ SOLN: 25.0000 ug | INTRAMUSCULAR | Status: DC | PRN

## 2020-12-29 MED ORDER — LIDOCAINE HCL (PF) 2 % IJ SOLN
INTRAMUSCULAR | Status: AC
  Filled 2020-12-29: qty 5

## 2020-12-29 MED ORDER — CEFAZOLIN SODIUM-DEXTROSE 2-4 GM/100ML-% IV SOLN
2.0000 g | INTRAVENOUS | Status: AC
  Administered 2020-12-29: 2 g via INTRAVENOUS
  Filled 2020-12-29: qty 100

## 2020-12-29 MED ORDER — BUPIVACAINE HCL (PF) 0.25 % IJ SOLN
INTRAMUSCULAR | Status: DC | PRN
  Administered 2020-12-29: 10 mL

## 2020-12-29 MED ORDER — DEXAMETHASONE SODIUM PHOSPHATE 10 MG/ML IJ SOLN
INTRAMUSCULAR | Status: DC | PRN
  Administered 2020-12-29: 5 mg via INTRAVENOUS

## 2020-12-29 MED ORDER — PROMETHAZINE HCL 25 MG/ML IJ SOLN: 6.2500 mg | INTRAMUSCULAR | Status: DC | PRN

## 2020-12-29 MED ORDER — ONDANSETRON HCL 4 MG/2ML IJ SOLN
INTRAMUSCULAR | Status: DC | PRN
  Administered 2020-12-29: 4 mg via INTRAVENOUS

## 2020-12-29 MED ORDER — FENTANYL CITRATE (PF) 100 MCG/2ML IJ SOLN
INTRAMUSCULAR | Status: AC
  Filled 2020-12-29: qty 2

## 2020-12-29 MED ORDER — ORAL CARE MOUTH RINSE: 15.0000 mL | Freq: Once | OROMUCOSAL | Status: AC

## 2020-12-29 MED ORDER — LACTATED RINGERS IV SOLN: INTRAVENOUS | Status: DC

## 2020-12-29 MED ORDER — CHLORHEXIDINE GLUCONATE 0.12 % MT SOLN
15.0000 mL | Freq: Once | OROMUCOSAL | Status: AC
  Administered 2020-12-29: 15 mL via OROMUCOSAL
  Filled 2020-12-29: qty 15

## 2020-12-29 MED ORDER — ONDANSETRON HCL 4 MG/2ML IJ SOLN
INTRAMUSCULAR | Status: AC
  Filled 2020-12-29: qty 2

## 2020-12-29 MED ORDER — ACETAMINOPHEN 10 MG/ML IV SOLN
INTRAVENOUS | Status: AC
  Filled 2020-12-29: qty 100

## 2020-12-29 MED ORDER — BUPIVACAINE HCL (PF) 0.25 % IJ SOLN
INTRAMUSCULAR | Status: AC
  Filled 2020-12-29: qty 30

## 2020-12-29 MED ORDER — 0.9 % SODIUM CHLORIDE (POUR BTL) OPTIME
TOPICAL | Status: DC | PRN
  Administered 2020-12-29: 1000 mL

## 2020-12-29 MED ORDER — LIDOCAINE HCL (CARDIAC) PF 100 MG/5ML IV SOSY
PREFILLED_SYRINGE | INTRAVENOUS | Status: DC | PRN
  Administered 2020-12-29: 60 mg via INTRAVENOUS

## 2020-12-29 MED ORDER — FENTANYL CITRATE (PF) 250 MCG/5ML IJ SOLN
INTRAMUSCULAR | Status: DC | PRN
  Administered 2020-12-29 (×2): 50 ug via INTRAVENOUS

## 2020-12-29 MED ORDER — ACETAMINOPHEN 10 MG/ML IV SOLN
1000.0000 mg | Freq: Once | INTRAVENOUS | Status: DC | PRN
  Administered 2020-12-29: 1000 mg via INTRAVENOUS

## 2020-12-29 MED ORDER — PROPOFOL 10 MG/ML IV BOLUS
INTRAVENOUS | Status: DC | PRN
  Administered 2020-12-29: 130 mg via INTRAVENOUS

## 2020-12-29 MED ORDER — HYDROCODONE-ACETAMINOPHEN 5-325 MG PO TABS: ORAL_TABLET | ORAL | 0 refills | Status: AC

## 2020-12-29 MED ORDER — MIDAZOLAM HCL 2 MG/2ML IJ SOLN
INTRAMUSCULAR | Status: AC
  Filled 2020-12-29: qty 2

## 2020-12-29 MED ORDER — MIDAZOLAM HCL 2 MG/2ML IJ SOLN
INTRAMUSCULAR | Status: DC | PRN
  Administered 2020-12-29: 2 mg via INTRAVENOUS

## 2020-12-29 MED ORDER — DEXAMETHASONE SODIUM PHOSPHATE 10 MG/ML IJ SOLN
INTRAMUSCULAR | Status: AC
  Filled 2020-12-29: qty 1

## 2020-12-29 SURGICAL SUPPLY — 38 items
BNDG COHESIVE 2X5 TAN STRL LF (GAUZE/BANDAGES/DRESSINGS) ×2 IMPLANT
BNDG ELASTIC 3X5.8 VLCR STR LF (GAUZE/BANDAGES/DRESSINGS) ×2 IMPLANT
BNDG ELASTIC 4X5.8 VLCR STR LF (GAUZE/BANDAGES/DRESSINGS) ×2 IMPLANT
BNDG ESMARK 4X9 LF (GAUZE/BANDAGES/DRESSINGS) ×2 IMPLANT
BNDG GAUZE ELAST 4 BULKY (GAUZE/BANDAGES/DRESSINGS) ×2 IMPLANT
CHLORAPREP W/TINT 26 (MISCELLANEOUS) ×2 IMPLANT
CORD BIPOLAR FORCEPS 12FT (ELECTRODE) ×2 IMPLANT
COVER SURGICAL LIGHT HANDLE (MISCELLANEOUS) ×2 IMPLANT
CUFF TOURN SGL QUICK 18X4 (TOURNIQUET CUFF) ×2 IMPLANT
DRAPE SURG 17X23 STRL (DRAPES) ×2 IMPLANT
DRSG PAD ABDOMINAL 8X10 ST (GAUZE/BANDAGES/DRESSINGS) ×4 IMPLANT
DRSG XEROFORM 1X8 (GAUZE/BANDAGES/DRESSINGS) ×2 IMPLANT
GAUZE SPONGE 4X4 12PLY STRL (GAUZE/BANDAGES/DRESSINGS) ×2 IMPLANT
GAUZE XEROFORM 1X8 LF (GAUZE/BANDAGES/DRESSINGS) ×2 IMPLANT
GLOVE BIO SURGEON STRL SZ7.5 (GLOVE) ×4 IMPLANT
GLOVE SRG 8 PF TXTR STRL LF DI (GLOVE) ×1 IMPLANT
GLOVE SURG UNDER POLY LF SZ8 (GLOVE) ×1
GOWN STRL REUS W/ TWL LRG LVL3 (GOWN DISPOSABLE) ×2 IMPLANT
GOWN STRL REUS W/TWL LRG LVL3 (GOWN DISPOSABLE) ×2
KIT BASIN OR (CUSTOM PROCEDURE TRAY) ×2 IMPLANT
KIT TURNOVER KIT B (KITS) ×2 IMPLANT
NEEDLE HYPO 25GX1X1/2 BEV (NEEDLE) IMPLANT
NS IRRIG 1000ML POUR BTL (IV SOLUTION) ×2 IMPLANT
PACK ORTHO EXTREMITY (CUSTOM PROCEDURE TRAY) ×2 IMPLANT
PAD ARMBOARD 7.5X6 YLW CONV (MISCELLANEOUS) ×4 IMPLANT
PADDING CAST ABS 4INX4YD NS (CAST SUPPLIES) ×1
PADDING CAST ABS COTTON 4X4 ST (CAST SUPPLIES) ×1 IMPLANT
SPONGE LAP 4X18 RFD (DISPOSABLE) ×2 IMPLANT
SUCTION FRAZIER HANDLE 10FR (MISCELLANEOUS)
SUCTION TUBE FRAZIER 10FR DISP (MISCELLANEOUS) IMPLANT
SUT ETHILON 3 0 PS 1 (SUTURE) IMPLANT
SUT ETHILON 4 0 PS 2 18 (SUTURE) ×2 IMPLANT
SUT VIC AB 3-0 SH 18 (SUTURE) IMPLANT
SYR CONTROL 10ML LL (SYRINGE) IMPLANT
TOWEL GREEN STERILE (TOWEL DISPOSABLE) ×2 IMPLANT
TOWEL GREEN STERILE FF (TOWEL DISPOSABLE) ×2 IMPLANT
TUBE CONNECTING 12X1/4 (SUCTIONS) IMPLANT
UNDERPAD 30X36 HEAVY ABSORB (UNDERPADS AND DIAPERS) ×2 IMPLANT

## 2020-12-29 NOTE — Discharge Instructions (Addendum)

## 2020-12-29 NOTE — Op Note (Signed)
12/29/2020 [facility] OR  Operative Note  PREOPERATIVE DIAGNOSIS: TRIGGER FINGER LEFT LONG FINGER, left index finger, left ring finger  POSTOPERATIVE DIAGNOSIS:  TRIGGER FINGER LEFT LONG FINGER, left index finger, left ring finger  PROCEDURE: Procedure(s): RELEASE TRIGGER FINGER/A-1 PULLEY LEFT INDEX FINGER 2. RELEASE TRIGGER FINGER/A-1 PULLEY LEFT LONG FINGER 3. RELEASE TRIGGER FINGER/A-1 PULLEY LEFT RING FINGER   SURGEON:  Betha Loa, MD  ASSISTANT:  none.  ANESTHESIA:  General.  IV FLUIDS:  Per anesthesia flow sheet.  ESTIMATED BLOOD LOSS:  Minimal.  COMPLICATIONS:  None.  SPECIMENS:  None.  TOURNIQUET TIME:  Total Tourniquet Time Documented: Upper Arm (Left) - 25 minutes Total: Upper Arm (Left) - 25 minutes   DISPOSITION:  Stable to PACU.  LOCATION: [facility] OR  INDICATIONS: Nichole Aguilar is a 50 y.o. female with triggering left index, long, and ring fingers.  The long finger has been injected without lasting resolution.  She wishes to have release of left index, long, ring finger trigger digits.  Risks, benefits and alternatives of surgery were discussed including the risk of blood loss, infection, damage to nerves, vessels, tendons, ligaments, bone, failure of surgery, need for additional surgery, complications with wound healing, continued pain, continued triggering and need for repeat surgery.  She voiced understanding of these risks and elected to proceed.  OPERATIVE COURSE:  After being identified preoperatively by myself, the patient and I agreed upon the procedure and site of procedure.  The surgical site was marked. Surgical consent had been signed. She was given IV Ancef as preoperative antibiotic prophylaxis. She was transported to the operating room and placed on the operating room table in supine position with the Left upper extremity on an arm board. General anesthesia was induced by the anesthesiologist.  The Left upper extremity was prepped and draped in normal sterile  orthopedic fashion. A surgical pause was performed between surgeons, anesthesia, and operating room staff, and all were in agreement as to the patient, procedure, and site of procedure.  Tourniquet at the proximal aspect of the extremity was inflated to 250 mmHg after exsanguination of the arm with an Esmarch bandage.  An incision was made at the volar aspect of the MP joint of the index finger.  This was carried into the subcutaneous tissues by preading technique.  Bipolar electrocautery was used to obtain hemostasis.  The radial and ulnar digital nerves were protected throughout the case. The flexor sheath was identified.  The A1 pulley was identified and sharply incised.  It was released in its entirety.  The proximal 1-2 mm of the A2 pulley was vented to allow better excursion of the tendons.  The finger was placed through a range of motion and there was noted to be no catching.  The tendons were brought through the wound and any adherences released.  An incision was then made at the volar aspect of the MP joint of the long finger.  This was carried into the subcutaneous tissues by preading technique.  Bipolar electrocautery was used to obtain hemostasis.  The radial and ulnar digital nerves were protected throughout the case. The flexor sheath was identified.  The A1 pulley was identified and sharply incised.  It was released in its entirety.  The proximal 1-2 mm of the A2 pulley was vented to allow better excursion of the tendons.  The finger was placed through a range of motion and there was noted to be no catching.  The tendons were brought through the wound and any adherences released.  An incision was then made at the volar aspect of the MP joint of the ring finger.  This was carried into the subcutaneous tissues by preading technique.  Bipolar electrocautery was used to obtain hemostasis.  The radial and ulnar digital nerves were protected throughout the case. The flexor sheath was identified.  The A1  pulley was identified and sharply incised.  It was released in its entirety.  The proximal 1-2 mm of the A2 pulley was vented to allow better excursion of the tendons.  The finger was placed through a range of motion and there was noted to be no catching.  The tendons were brought through the wound and any adherences released.  The wounds were then copiously irrigated with sterile saline. They were closed with 4-0 nylon in a horizontal mattress fashion.  They were injected with 0.25% plain Marcaine to aid in postoperative analgesia.  They were dressed with sterile Xeroform, 4x4s, and wrapped lightly with a Coban dressing.  Tourniquet was deflated at 25 minutes.  The fingertips were pink with brisk capillary refill after deflation of the tourniquet.  The operative drapes were broken down and the patient was awoken from anesthesia safely.  She was transferred back to the stretcher and taken to the PACU in stable condition.   I will see her back in the office in 1 week for postoperative followup.  I will give her a prescription for Norco 5/325 1-2 tabs PO q6 hours prn pain, dispense # 20.    Betha Loa, MD Electronically signed, 12/29/20

## 2020-12-29 NOTE — Transfer of Care (Signed)
Immediate Anesthesia Transfer of Care Note  Patient: Nichole Aguilar  Procedure(s) Performed: RELEASE TRIGGER FINGER/A-1 PULLEY LEFT LONG, INDEX, AND RING FINGERS (Left: Hand)  Patient Location: PACU  Anesthesia Type:General  Level of Consciousness: awake and alert   Airway & Oxygen Therapy: Patient Spontanous Breathing and Patient connected to face mask oxygen  Post-op Assessment: Report given to RN and Post -op Vital signs reviewed and stable  Post vital signs: Reviewed and stable  Last Vitals:  Vitals Value Taken Time  BP 133/89 12/29/20 1559  Temp 36.2 C 12/29/20 1559  Pulse 65 12/29/20 1601  Resp 18 12/29/20 1601  SpO2 99 % 12/29/20 1601  Vitals shown include unvalidated device data.  Last Pain:  Vitals:   12/29/20 1450  TempSrc:   PainSc: 0-No pain         Complications: No notable events documented.

## 2020-12-29 NOTE — H&P (Signed)
Nichole Aguilar is an 50 y.o. female.   Chief Complaint: left trigger digits HPI: 50 yo female with triggering left index, long, ring fingers.  The long finger has been injected without lasting relief.  The index and ring fingers have been triggering for ~1 month.  She wishes to have trigger release of left index, long, and ring fingers for management of triggering.  Allergies:  Allergies  Allergen Reactions   Codeine Nausea And Vomiting   Geodon [Ziprasidone Hcl] Nausea And Vomiting   Wellbutrin [Bupropion] Other (See Comments)    Reaction:  Hallucinations   Dilaudid [Hydromorphone Hcl] Nausea And Vomiting and Other (See Comments)    Reaction:  Hallucinations   Latex Hives   Tagamet [Cimetidine] Nausea And Vomiting    Past Medical History:  Diagnosis Date   ADD (attention deficit disorder)    Anxiety    Arthritis    Complication of anesthesia    Crohn's disease (HCC) 2002   Crohn's disease (HCC) 12/13/2013   Depression    PTSD   Dyspareunia    Fibromyalgia 2000   Hypothyroidism 12/10/2015   Lupus (HCC)    Stroke (HCC) 2012   left sided weakness, 12/25/20 weakness has improved - patient has some memory issuses.    Past Surgical History:  Procedure Laterality Date   ABDOMINAL HYSTERECTOMY     ANKLE SURGERY     CHOLECYSTECTOMY  1997   laparoscopic   ELBOW SURGERY     HAND SURGERY     TOTAL ABDOMINAL HYSTERECTOMY W/ BILATERAL SALPINGOOPHORECTOMY  2008   recurrent ovarian cysts    Family History: Family History  Problem Relation Age of Onset   Hodgkin's lymphoma Mother    Stroke Mother    Hypertension Father    Diabetes type II Father    Cancer Father    Breast cancer Maternal Aunt    Osteoporosis Other        Paternal side    Social History:   reports that she quit smoking about 10 years ago. Her smoking use included cigarettes. She has never used smokeless tobacco. She reports that she does not drink alcohol and does not use drugs.  Medications: Medications  Prior to Admission  Medication Sig Dispense Refill   amphetamine-dextroamphetamine (ADDERALL) 30 MG tablet Take 30 mg by mouth 2 (two) times daily.     citalopram (CELEXA) 40 MG tablet Take 40 mg by mouth daily.     estradiol (ESTRACE) 1 MG tablet Take 1 mg by mouth daily.     OVER THE COUNTER MEDICATION Take 1 tablet by mouth daily. Medicore vitamin      No results found for this or any previous visit (from the past 48 hour(s)).  No results found.   A comprehensive review of systems was negative.  Blood pressure 129/70, pulse 66, temperature 98 F (36.7 C), temperature source Oral, resp. rate 20, height 5' (1.524 m), weight 74.8 kg, SpO2 100 %.  General appearance: alert, cooperative, and appears stated age Head: Normocephalic, without obvious abnormality, atraumatic Neck: supple, symmetrical, trachea midline Cardio: regular rate and rhythm Resp: clear to auscultation bilaterally Extremities: Intact sensation and capillary refill all digits.  +epl/fpl/io.  No wounds. Triggering left long finger.  Palpable and tender flexor tendon nodules of left index and ring fingers.  These will catch with compression over the A1 pulley. Pulses: 2+ and symmetric Skin: Skin color, texture, turgor normal. No rashes or lesions Neurologic: Grossly normal Incision/Wound: none  Assessment/Plan Left index, long, and  ring finger trigger digits.  These are bothersome to her.  She wishes to have trigger release of the left index, long, and ring fingers.  She wishes to forego injections of the index and ring finger and proceed with surgical release.  Non operative and operative treatment options have been discussed with the patient and patient wishes to proceed with operative treatment. Risks, benefits, and alternatives of surgery have been discussed and the patient agrees with the plan of care.   Betha Loa 12/29/2020, 2:42 PM

## 2020-12-29 NOTE — Anesthesia Procedure Notes (Signed)
Procedure Name: LMA Insertion Date/Time: 12/29/2020 3:10 PM Performed by: Tressia Miners, CRNA Pre-anesthesia Checklist: Patient identified, Emergency Drugs available, Suction available and Patient being monitored Patient Re-evaluated:Patient Re-evaluated prior to induction Oxygen Delivery Method: Circle System Utilized Preoxygenation: Pre-oxygenation with 100% oxygen Induction Type: IV induction Ventilation: Mask ventilation without difficulty LMA: LMA inserted LMA Size: 4.0 Number of attempts: 1 Airway Equipment and Method: Bite block Placement Confirmation: positive ETCO2 Tube secured with: Tape Dental Injury: Teeth and Oropharynx as per pre-operative assessment

## 2020-12-29 NOTE — Anesthesia Preprocedure Evaluation (Signed)
Anesthesia Evaluation  Patient identified by MRN, date of birth, ID band Patient awake    Reviewed: Allergy & Precautions, NPO status , Patient's Chart, lab work & pertinent test results  Airway Mallampati: II  TM Distance: >3 FB Neck ROM: Full    Dental  (+) Teeth Intact   Pulmonary neg pulmonary ROS, former smoker,    Pulmonary exam normal        Cardiovascular negative cardio ROS   Rhythm:Regular Rate:Normal     Neuro/Psych Anxiety Depression CVA (left sided weakness), Residual Symptoms    GI/Hepatic negative GI ROS, Neg liver ROS,   Endo/Other  Hypothyroidism   Renal/GU negative Renal ROS  negative genitourinary   Musculoskeletal  (+) Arthritis , Osteoarthritis,  Fibromyalgia -Left trigger finger   Abdominal (+)  Abdomen: soft. Bowel sounds: normal.  Peds  Hematology negative hematology ROS (+)   Anesthesia Other Findings   Reproductive/Obstetrics                             Anesthesia Physical Anesthesia Plan  ASA: 3  Anesthesia Plan: General   Post-op Pain Management:    Induction: Intravenous  PONV Risk Score and Plan: 3 and Ondansetron, Dexamethasone, Midazolam and Treatment may vary due to age or medical condition  Airway Management Planned: Mask and LMA  Additional Equipment: None  Intra-op Plan:   Post-operative Plan: Extubation in OR  Informed Consent: I have reviewed the patients History and Physical, chart, labs and discussed the procedure including the risks, benefits and alternatives for the proposed anesthesia with the patient or authorized representative who has indicated his/her understanding and acceptance.     Dental advisory given  Plan Discussed with: CRNA  Anesthesia Plan Comments:         Anesthesia Quick Evaluation

## 2020-12-29 NOTE — H&P (Deleted)
  Nichole Aguilar is an 50 y.o. female.   Chief Complaint: trigger finger HPI: 50 yo female with triggering left long finger.  This has been injected without lasting resolution.  She wishes to have surgical release.  Allergies:  Allergies  Allergen Reactions   Codeine Nausea And Vomiting   Geodon [Ziprasidone Hcl] Nausea And Vomiting   Wellbutrin [Bupropion] Other (See Comments)    Reaction:  Hallucinations   Dilaudid [Hydromorphone Hcl] Nausea And Vomiting and Other (See Comments)    Reaction:  Hallucinations   Latex Hives   Tagamet [Cimetidine] Nausea And Vomiting    Past Medical History:  Diagnosis Date   ADD (attention deficit disorder)    Anxiety    Arthritis    Complication of anesthesia    Crohn's disease (HCC) 2002   Crohn's disease (HCC) 12/13/2013   Depression    PTSD   Dyspareunia    Fibromyalgia 2000   Hypothyroidism 12/10/2015   Lupus (HCC)    Stroke (HCC) 2012   left sided weakness, 12/25/20 weakness has improved - patient has some memory issuses.    Past Surgical History:  Procedure Laterality Date   ABDOMINAL HYSTERECTOMY     ANKLE SURGERY     CHOLECYSTECTOMY  1997   laparoscopic   ELBOW SURGERY     HAND SURGERY     TOTAL ABDOMINAL HYSTERECTOMY W/ BILATERAL SALPINGOOPHORECTOMY  2008   recurrent ovarian cysts    Family History: Family History  Problem Relation Age of Onset   Hodgkin's lymphoma Mother    Stroke Mother    Hypertension Father    Diabetes type II Father    Cancer Father    Breast cancer Maternal Aunt    Osteoporosis Other        Paternal side    Social History:   reports that she quit smoking about 10 years ago. Her smoking use included cigarettes. She has never used smokeless tobacco. She reports that she does not drink alcohol and does not use drugs.  Medications: No medications prior to admission.    No results found for this or any previous visit (from the past 48 hour(s)).  No results found.   A comprehensive review  of systems was negative.  Height 5' (1.524 m), weight 72.6 kg.  General appearance: alert, cooperative, and appears stated age Head: Normocephalic, without obvious abnormality, atraumatic Neck: supple, symmetrical, trachea midline Cardio: regular rate and rhythm Resp: clear to auscultation bilaterally Extremities: Intact sensation and capillary refill all digits.  +epl/fpl/io.  No wounds.  Pulses: 2+ and symmetric Skin: Skin color, texture, turgor normal. No rashes or lesions Neurologic: Grossly normal Incision/Wound: none  Assessment/Plan Left long finger trigger digit.  Non operative and operative treatment options have been discussed with the patient and patient wishes to proceed with operative treatment. Risks, benefits, and alternatives of surgery have been discussed and the patient agrees with the plan of care.   Betha Loa 12/29/2020, 1:53 PM

## 2020-12-30 ENCOUNTER — Encounter (HOSPITAL_COMMUNITY): Payer: Self-pay | Admitting: Orthopedic Surgery

## 2020-12-30 NOTE — Anesthesia Postprocedure Evaluation (Signed)
Anesthesia Post Note  Patient: Antonietta Breach  Procedure(s) Performed: RELEASE TRIGGER FINGER/A-1 PULLEY LEFT LONG, INDEX, AND RING FINGERS (Left: Hand)     Patient location during evaluation: PACU Anesthesia Type: General Level of consciousness: awake and alert Pain management: pain level controlled Vital Signs Assessment: post-procedure vital signs reviewed and stable Respiratory status: spontaneous breathing, nonlabored ventilation, respiratory function stable and patient connected to nasal cannula oxygen Cardiovascular status: blood pressure returned to baseline and stable Postop Assessment: no apparent nausea or vomiting Anesthetic complications: no   No notable events documented.  Last Vitals:  Vitals:   12/29/20 1614 12/29/20 1629  BP: 126/83 124/86  Pulse: 74 72  Resp: 18 14  Temp:    SpO2: 94% 100%    Last Pain:  Vitals:   12/29/20 1614  TempSrc:   PainSc: 8                  Raynelle Fujikawa P Virginie Josten

## 2021-06-22 ENCOUNTER — Emergency Department (HOSPITAL_COMMUNITY)

## 2021-06-22 ENCOUNTER — Encounter (HOSPITAL_COMMUNITY): Payer: Self-pay | Admitting: *Deleted

## 2021-06-22 ENCOUNTER — Emergency Department (HOSPITAL_COMMUNITY)
Admission: EM | Admit: 2021-06-22 | Discharge: 2021-06-22 | Disposition: A | Discharge status: Home / Self Care | Attending: Emergency Medicine | Admitting: Emergency Medicine

## 2021-06-22 DIAGNOSIS — M25571 Pain in right ankle and joints of right foot: Secondary | ICD-10-CM

## 2021-06-22 DIAGNOSIS — Z91040 Latex allergy status: Secondary | ICD-10-CM | POA: Insufficient documentation

## 2021-06-22 DIAGNOSIS — E039 Hypothyroidism, unspecified: Secondary | ICD-10-CM | POA: Diagnosis not present

## 2021-06-22 DIAGNOSIS — Z87891 Personal history of nicotine dependence: Secondary | ICD-10-CM | POA: Diagnosis not present

## 2021-06-22 NOTE — Discharge Instructions (Addendum)
Return if any problems.

## 2021-06-22 NOTE — ED Triage Notes (Signed)
Swelling left ankle

## 2021-06-22 NOTE — ED Provider Notes (Signed)
[facility] EMERGENCY DEPARTMENT Provider Note   CSN: [id number] Arrival date & time: 06/22/21  1609     History Chief Complaint  Patient presents with   Ankle Pain    Nichole Aguilar is a 50 y.o. female.  Pt reports she has a history of right ankle surgery.  Pt reports she awoke from sleep with swollen ankle   The history is provided by the patient. No language interpreter was used.  Ankle Pain Location:  Ankle Time since incident:  2 days Injury: yes   Ankle location:  R ankle Pain details:    Quality:  Aching   Radiates to:  Does not radiate   Severity:  Moderate   Onset quality:  Gradual   Duration:  2 days   Timing:  Constant   Progression:  Worsening Chronicity:  New Foreign body present:  No foreign bodies Prior injury to area:  No Relieved by:  Nothing Worsened by:  Nothing Ineffective treatments:  None tried Associated symptoms: swelling       Past Medical History:  Diagnosis Date   ADD (attention deficit disorder)    Anxiety    Arthritis    Complication of anesthesia    Crohn's disease (HCC) 2002   Crohn's disease (HCC) 12/13/2013   Depression    PTSD   Dyspareunia    Fibromyalgia 2000   Hypothyroidism 12/10/2015   Lupus (HCC)    Stroke (HCC) 2012   left sided weakness, 12/25/20 weakness has improved - patient has some memory issuses.    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   Intentional benzodiazepine overdose (HCC) 12/13/2016   Adjustment disorder with mixed anxiety and depressed mood 12/13/2016   Weakness 12/10/2015   Anxiety 12/10/2015   Hypothyroidism 12/10/2015   Chronic pain 12/10/2015   Weakness of right side of body 12/10/2015   Emotional crisis, acute reaction to stress    Crohn's disease (HCC) 12/13/2013   Depression     Past Surgical History:  Procedure Laterality Date   ABDOMINAL HYSTERECTOMY     ANKLE SURGERY      CHOLECYSTECTOMY  1997   laparoscopic   ELBOW SURGERY     HAND SURGERY     TOTAL ABDOMINAL HYSTERECTOMY W/ BILATERAL SALPINGOOPHORECTOMY  2008   recurrent ovarian cysts   TRIGGER FINGER RELEASE Left 12/29/2020   Procedure: RELEASE TRIGGER FINGER/A-1 PULLEY LEFT LONG, INDEX, AND RING FINGERS;  Surgeon: Betha Loa, MD;  Location: [facility] OR;  Service: Orthopedics;  Laterality: Left;     OB History     Gravida  4   Para  4   Term  4   Preterm      AB      Living  4      SAB      IAB      Ectopic      Multiple      Live Births              Family History  Problem Relation Age of Onset   Hodgkin's lymphoma Mother    Stroke Mother    Hypertension Father    Diabetes type II Father    Cancer Father    Breast cancer Maternal Aunt    Osteoporosis Other        Paternal side    Social History   Tobacco Use   Smoking status: Former    Types:  Cigarettes    Quit date: 07/12/2010    Years since quitting: 10.9   Smokeless tobacco: Never  Vaping Use   Vaping Use: Never used  Substance Use Topics   Alcohol use: No   Drug use: No    Comment: UDS + for Amphetamines and Benzos    Home Medications Prior to Admission medications   Medication Sig Start Date End Date Taking? Authorizing Provider  amphetamine-dextroamphetamine (ADDERALL) 30 MG tablet Take 30 mg by mouth 2 (two) times daily. 12/09/20   [provider]  citalopram (CELEXA) 40 MG tablet Take 40 mg by mouth daily. 12/08/17   [provider]  estradiol (ESTRACE) 1 MG tablet Take 1 mg by mouth daily. 12/09/20   [provider]  HYDROcodone-acetaminophen (NORCO) 5-325 MG tablet 1-2 tabs po q6 hours prn pain 12/29/20   Leanora Cover, MD  OVER THE COUNTER MEDICATION Take 1 tablet by mouth daily. Medicore vitamin    [provider]    Allergies    Codeine, Geodon [ziprasidone hcl], Wellbutrin [bupropion], Dilaudid [hydromorphone hcl], Latex, and Tagamet [cimetidine]  Review of  Systems   Review of Systems  All other systems reviewed and are negative.  Physical Exam Updated Vital Signs BP 120/77   Pulse 88   Temp 99.2 F (37.3 C)   Resp 18   SpO2 100%   Physical Exam Vitals reviewed.  Constitutional:      Appearance: Normal appearance.  Musculoskeletal:        General: Swelling and tenderness present.  Skin:    General: Skin is warm.  Neurological:     General: No focal deficit present.     Mental Status: She is alert.  Psychiatric:        Mood and Affect: Mood normal.    ED Results / Procedures / Treatments   Labs (all labs ordered are listed, but only abnormal results are displayed) Labs Reviewed - No data to display  EKG None  Radiology DG Ankle Complete Right  Result Date: 06/22/2021 CLINICAL DATA:  Right ankle swelling.  No known injury. EXAM: RIGHT ANKLE - COMPLETE 3+ VIEW COMPARISON:  04/01/2014 FINDINGS: Lateral soft tissue swelling. No acute bony abnormality. Specifically, no fracture, subluxation, or dislocation. Joint spaces maintained. IMPRESSION: Lateral soft tissue swelling.  No acute bony abnormality. Electronically Signed   By: Rolm Baptise M.D.   On: 06/22/2021 17:46    Procedures Procedures   Medications Ordered in ED Medications - No data to display  ED Course  I have reviewed the triage vital signs and the nursing notes.  Pertinent labs & imaging results that were available during my care of the patient were reviewed by me and considered in my medical decision making (see chart for details).    MDM Rules/Calculators/A&P                           MDM:  Pt concerned that she could have a lgament of tendon injury.  Pt placed in aso. Pt advised to schedule to see Orthopaedist for evalatuion  Final Clinical Impression(s) / ED Diagnoses Final diagnoses:  Acute right ankle pain    Rx / DC Orders ED Discharge Orders     None     An After Visit Summary was printed and given to the patient.    Sidney Ace 06/22/21 2322    Hayden Rasmussen, MD 06/23/21 [phone]

## 2021-06-23 ENCOUNTER — Ambulatory Visit: Payer: Self-pay

## 2022-01-07 ENCOUNTER — Encounter (HOSPITAL_COMMUNITY): Payer: Self-pay | Admitting: Emergency Medicine

## 2022-01-07 ENCOUNTER — Emergency Department (HOSPITAL_COMMUNITY)
Admission: EM | Admit: 2022-01-07 | Discharge: 2022-01-08 | Disposition: A | Discharge status: Home / Self Care | Attending: Emergency Medicine | Admitting: Emergency Medicine

## 2022-01-07 ENCOUNTER — Emergency Department (HOSPITAL_COMMUNITY)

## 2022-01-07 DIAGNOSIS — R079 Chest pain, unspecified: Secondary | ICD-10-CM | POA: Diagnosis not present

## 2022-01-07 DIAGNOSIS — R0789 Other chest pain: Secondary | ICD-10-CM

## 2022-01-07 DIAGNOSIS — Z91040 Latex allergy status: Secondary | ICD-10-CM | POA: Insufficient documentation

## 2022-01-07 DIAGNOSIS — M545 Low back pain, unspecified: Secondary | ICD-10-CM

## 2022-01-07 DIAGNOSIS — E039 Hypothyroidism, unspecified: Secondary | ICD-10-CM | POA: Diagnosis not present

## 2022-01-07 NOTE — ED Triage Notes (Signed)
Pt c/o mid back pain for the past 2 days. States today she began having center chest pain, pt states she also feels like she is having back spasms that causes her to loose her breath.

## 2022-01-08 ENCOUNTER — Emergency Department (HOSPITAL_COMMUNITY)

## 2022-01-08 LAB — TROPONIN I (HIGH SENSITIVITY)
Troponin I (High Sensitivity): <2 ng/L (ref <18)
Troponin I (High Sensitivity): <2 ng/L (ref <18)

## 2022-01-08 LAB — CBC
HCT: 38.8 % (ref 36.0–46.0)
Hemoglobin: 13.1 g/dL (ref 12.0–15.0)
MCH: 31.6 pg (ref 26.0–34.0)
MCHC: 33.8 g/dL (ref 30.0–36.0)
MCV: 93.7 fL (ref 80.0–100.0)
Platelets: 343 10*3/uL (ref 150–400)
RBC: 4.14 MIL/uL (ref 3.87–5.11)
RDW: 13 % (ref 11.5–15.5)
WBC: 9.5 10*3/uL (ref 4.0–10.5)
nRBC: 0 % (ref 0.0–0.2)

## 2022-01-08 LAB — BASIC METABOLIC PANEL
Anion gap: 8 (ref 5–15)
BUN: 15 mg/dL (ref 6–20)
CO2: 25 mmol/L (ref 22–32)
Calcium: 9 mg/dL (ref 8.9–10.3)
Chloride: 104 mmol/L (ref 98–111)
Creatinine, Ser: 0.94 mg/dL (ref 0.44–1.00)
GFR, Estimated: >60 mL/min (ref >60)
Glucose, Bld: 101 mg/dL — ABNORMAL HIGH (ref 70–99)
Potassium: 4.1 mmol/L (ref 3.5–5.1)
Sodium: 137 mmol/L (ref 135–145)

## 2022-01-08 LAB — HEPATIC FUNCTION PANEL
ALT: 13 U/L (ref 0–44)
AST: 14 U/L — ABNORMAL LOW (ref 15–41)
Albumin: 3.6 g/dL (ref 3.5–5.0)
Alkaline Phosphatase: 84 U/L (ref 38–126)
Bilirubin, Direct: <0.1 mg/dL (ref 0.0–0.2)
Total Bilirubin: 0.5 mg/dL (ref 0.3–1.2)
Total Protein: 6.9 g/dL (ref 6.5–8.1)

## 2022-01-08 MED ORDER — ONDANSETRON HCL 4 MG/2ML IJ SOLN
4.0000 mg | Freq: Once | INTRAMUSCULAR | Status: AC
Start: 2022-01-08 — End: 2022-01-08
  Administered 2022-01-08: 4 mg via INTRAVENOUS
  Filled 2022-01-08: qty 2

## 2022-01-08 MED ORDER — TRAMADOL HCL 50 MG PO TABS: 50.0000 mg | ORAL_TABLET | Freq: Four times a day (QID) | ORAL | 0 refills | Status: AC | PRN

## 2022-01-08 MED ORDER — IOHEXOL 350 MG/ML SOLN
100.0000 mL | Freq: Once | INTRAVENOUS | Status: AC | PRN
  Administered 2022-01-08: 100 mL via INTRAVENOUS

## 2022-01-08 MED ORDER — MORPHINE SULFATE (PF) 4 MG/ML IV SOLN
4.0000 mg | Freq: Once | INTRAVENOUS | Status: AC
  Administered 2022-01-08: 4 mg via INTRAVENOUS
  Filled 2022-01-08: qty 1

## 2022-01-08 MED ORDER — NAPROXEN 500 MG PO TABS: 500.0000 mg | ORAL_TABLET | Freq: Two times a day (BID) | ORAL | 0 refills | Status: AC

## 2022-01-08 NOTE — ED Provider Notes (Signed)
[facility] EMERGENCY DEPARTMENT Provider Note   CSN: [id number] Arrival date & time: 01/07/22  2328     History  Chief Complaint  Patient presents with   Chest Pain   Back Pain    Nichole Aguilar is a 51 y.o. female.  Patient is a 51 year old female with past medical history of Crohn's disease, depression, hypothyroidism.  Patient presenting today with complaints of back pain and chest pain.  She tells me that she started 2 days ago with discomfort to her low back that has worsened.  This began in the absence of any injury or trauma.  This pain is now radiating up into her chest.  She feels tight in her chest and as if she is having difficulty taking deep breaths.  She denies any fevers or chills.  She denies any urinary complaints.  She denies any cough.  There are no alleviating factors.  Pain is worse when she attempts to lay flat, sit forward, or take a deep breath.    The history is provided by the patient.       Home Medications Prior to Admission medications   Medication Sig Start Date End Date Taking? Authorizing Provider  amphetamine-dextroamphetamine (ADDERALL) 30 MG tablet Take 30 mg by mouth 2 (two) times daily. 12/09/20   [provider]  citalopram (CELEXA) 40 MG tablet Take 40 mg by mouth daily. 12/08/17   [provider]  estradiol (ESTRACE) 1 MG tablet Take 1 mg by mouth daily. 12/09/20   [provider]  HYDROcodone-acetaminophen (NORCO) 5-325 MG tablet 1-2 tabs po q6 hours prn pain 12/29/20   Betha Loa, MD  OVER THE COUNTER MEDICATION Take 1 tablet by mouth daily. Medicore vitamin    [provider]      Allergies    Codeine, Geodon [ziprasidone hcl], Wellbutrin [bupropion], Dilaudid [hydromorphone hcl], Latex, and Tagamet [cimetidine]    Review of Systems   Review of Systems  All other systems reviewed and are negative.   Physical Exam Updated Vital Signs BP 108/65   Pulse 64   Temp 97.8 F (36.6 C) (Oral)   Resp 15    Ht 5' (1.524 m)   Wt 74.8 kg   SpO2 99%   BMI 32.21 kg/m  Physical Exam Vitals and nursing note reviewed.  Constitutional:      General: She is not in acute distress.    Appearance: She is well-developed. She is not diaphoretic.  HENT:     Head: Normocephalic and atraumatic.  Cardiovascular:     Rate and Rhythm: Normal rate and regular rhythm.     Heart sounds: No murmur heard.    No friction rub. No gallop.  Pulmonary:     Effort: Pulmonary effort is normal. No respiratory distress.     Breath sounds: Normal breath sounds. No wheezing.  Abdominal:     General: Bowel sounds are normal. There is no distension.     Palpations: Abdomen is soft.     Tenderness: There is no abdominal tenderness.  Musculoskeletal:        General: Normal range of motion.     Cervical back: Normal range of motion and neck supple.     Comments: There is tenderness to palpation in the soft tissues of the right lumbar region.  There is no bony tenderness or step-off.  Skin:    General: Skin is warm and dry.  Neurological:     General: No focal deficit present.     Mental  Status: She is alert and oriented to person, place, and time.     ED Results / Procedures / Treatments   Labs (all labs ordered are listed, but only abnormal results are displayed) Labs Reviewed  BASIC METABOLIC PANEL - Abnormal; Notable for the following components:      Result Value   Glucose, Bld 101 (*)    All other components within normal limits  CBC  TROPONIN I (HIGH SENSITIVITY)  TROPONIN I (HIGH SENSITIVITY)    EKG EKG Interpretation  Date/Time:  Thursday January 07 2022 23:42:46 EDT Ventricular Rate:  71 PR Interval:  142 QRS Duration: 106 QT Interval:  428 QTC Calculation: 462 R Axis:   62 Text Interpretation: Sinus rhythm Low voltage, precordial leads No significant change since 12/11/2016 Confirmed by Geoffery Lyons ([id number]) on 01/08/2022 12:05:26 AM  Radiology DG Chest Portable 1 View  Result Date:  01/08/2022 CLINICAL DATA:  Chest pain EXAM: PORTABLE CHEST 1 VIEW COMPARISON:  None Available. FINDINGS: The heart size and mediastinal contours are within normal limits. Both lungs are clear. The visualized skeletal structures are unremarkable. IMPRESSION: No active disease. Electronically Signed   By: Helyn Numbers M.D.   On: 01/08/2022 00:22    Procedures Procedures    Medications Ordered in ED Medications  morphine (PF) 4 MG/ML injection 4 mg (has no administration in time range)  ondansetron (ZOFRAN) injection 4 mg (has no administration in time range)    ED Course/ Medical Decision Making/ A&P  This patient presents to the ED for concern of back and chest pain, this involves an extensive number of treatment options, and is a complaint that carries with it a high risk of complications and morbidity.  The differential diagnosis includes musculoskeletal pain, aortic dissection, pulmonary embolism   Co morbidities that complicate the patient evaluation  None   Additional history obtained:  No additional history or external records needed   Lab Tests:  I Ordered, and personally interpreted labs.  The pertinent results include: Unremarkable CBC, metabolic panel, troponin   Imaging Studies ordered:  I ordered imaging studies including CTA of chest abdomen pelvis  I independently visualized and interpreted imaging which showed no acute process I agree with the radiologist interpretation   Cardiac Monitoring: / EKG:  The patient was maintained on a cardiac monitor.  I personally viewed and interpreted the cardiac monitored which showed an underlying rhythm of: sinus   Consultations Obtained:  No consultations needed.   Problem List / ED Course / Critical interventions / Medication management  Patient presenting here with complaints of chest and back pain as described in the HPI.  Patient's symptoms are somewhat unusual, but nothing today appears acute.  At this point,  I feel as though patient is stable for discharge as her CTA of chest, abdomen, and pelvis was negative. I ordered medication including morphine and Zofran for pain and nausea Reevaluation of the patient after these medicines showed that the patient improved I have reviewed the patients home medicines and have made adjustments as needed   Social Determinants of Health:  None   Test / Admission - Considered:  No indication for admission found.   Final Clinical Impression(s) / ED Diagnoses Final diagnoses:  None    Rx / DC Orders ED Discharge Orders     None         Geoffery Lyons, MD 01/08/22 [phone]

## 2022-01-08 NOTE — Discharge Instructions (Signed)
Begin taking naproxen as prescribed.  Begin taking tramadol as prescribed as needed for pain not relieved with naproxen.  Rest.  Follow-up with your primary doctor if symptoms are not improving in the next week. 

## 2023-05-10 ENCOUNTER — Other Ambulatory Visit (HOSPITAL_COMMUNITY): Payer: Self-pay

## 2023-05-10 ENCOUNTER — Other Ambulatory Visit (HOSPITAL_BASED_OUTPATIENT_CLINIC_OR_DEPARTMENT_OTHER): Payer: Self-pay

## 2023-05-10 MED ORDER — WEGOVY 0.25 MG/0.5ML ~~LOC~~ SOAJ
0.2500 mg | SUBCUTANEOUS | 0 refills | Status: DC
  Filled 2023-05-10 (×2): qty 2, 28d supply, fill #0

## 2023-06-02 ENCOUNTER — Other Ambulatory Visit (HOSPITAL_COMMUNITY): Payer: Self-pay

## 2023-06-02 MED ORDER — WEGOVY 0.25 MG/0.5ML ~~LOC~~ SOAJ
0.2500 mg | SUBCUTANEOUS | 0 refills | Status: DC
  Filled 2023-06-02: qty 2, 28d supply, fill #0

## 2023-06-03 ENCOUNTER — Other Ambulatory Visit (HOSPITAL_COMMUNITY): Payer: Self-pay

## 2023-06-03 MED ORDER — WEGOVY 0.5 MG/0.5ML ~~LOC~~ SOAJ
0.5000 mL | SUBCUTANEOUS | 1 refills | Status: AC
  Filled 2023-06-03: qty 2, 28d supply, fill #0

## 2023-06-06 ENCOUNTER — Other Ambulatory Visit (HOSPITAL_COMMUNITY): Payer: Self-pay

## 2023-09-05 IMAGING — DX DG ANKLE COMPLETE 3+V*R*
3 series · 3 of 3 positions shown · non-contrast
Comparison: 04/01/2014

CLINICAL DATA: Right ankle swelling.  No known injury.

EXAM:
RIGHT ANKLE - COMPLETE 3+ VIEW

[ankle ap]
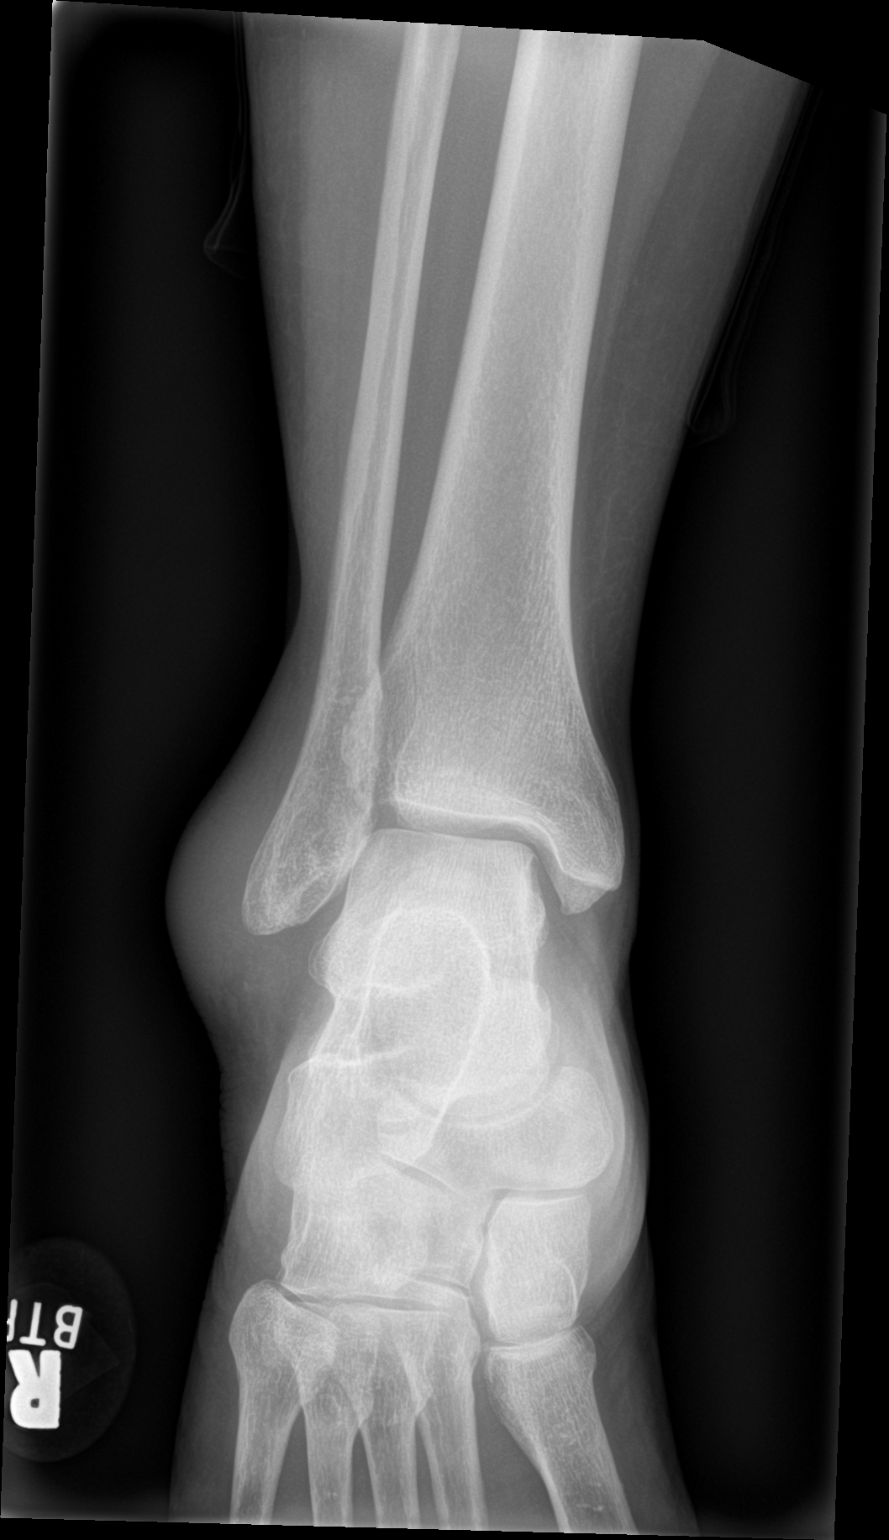

[ankle obl]
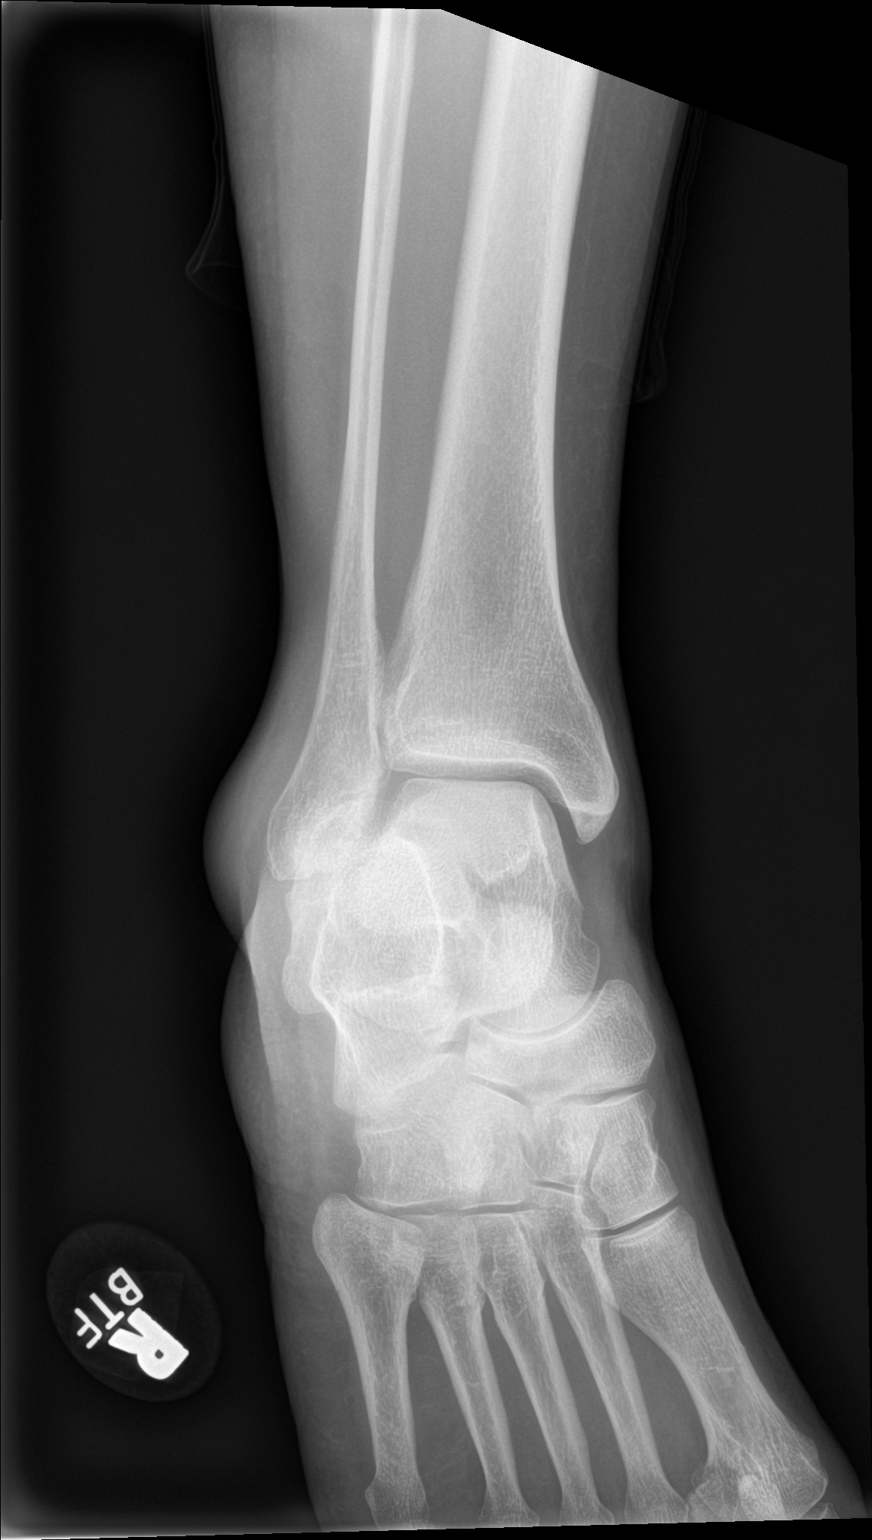

[ankle lat]
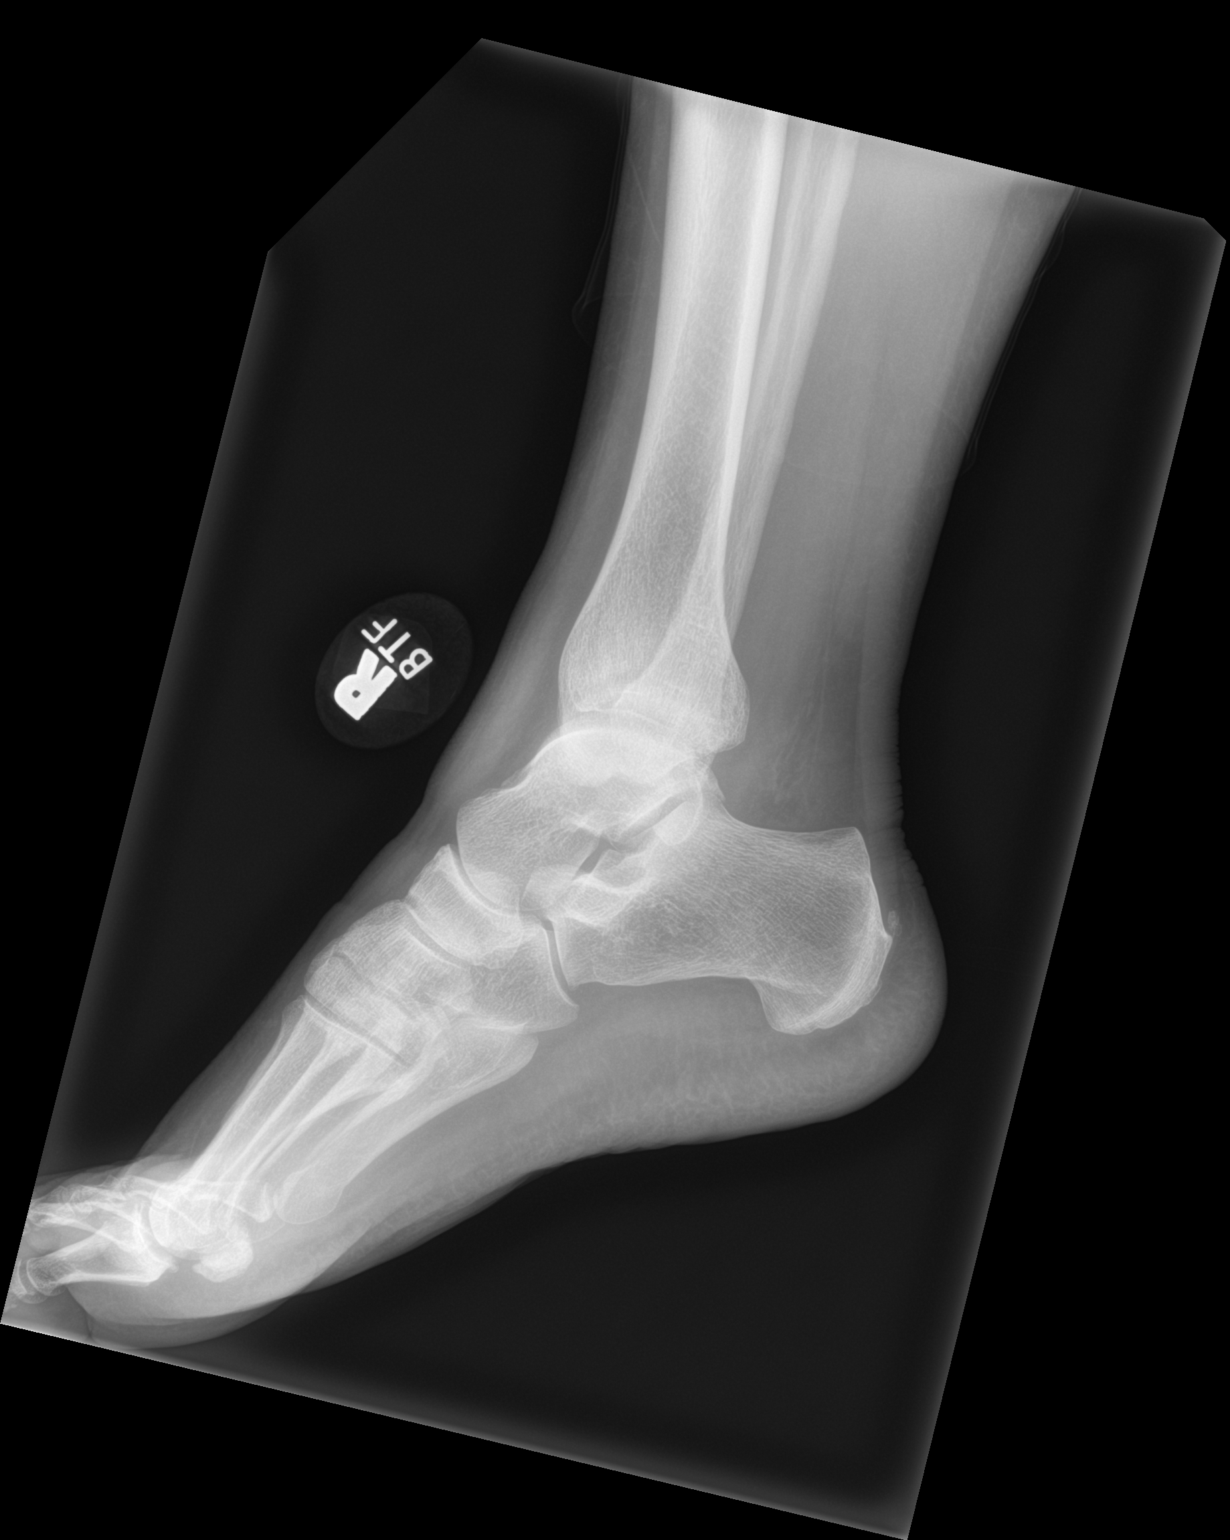

[3 of 3 positions shown; findings below may reference images not displayed]

FINDINGS: Lateral soft tissue swelling. No acute bony abnormality.
Specifically, no fracture, subluxation, or dislocation. Joint spaces
maintained.
IMPRESSION: Lateral soft tissue swelling.  No acute bony abnormality.

## 2023-11-25 ENCOUNTER — Emergency Department (HOSPITAL_COMMUNITY)

## 2023-11-25 ENCOUNTER — Other Ambulatory Visit: Payer: Self-pay

## 2023-11-25 ENCOUNTER — Encounter (HOSPITAL_COMMUNITY): Payer: Self-pay

## 2023-11-25 ENCOUNTER — Emergency Department (HOSPITAL_COMMUNITY)
Admission: EM | Admit: 2023-11-25 | Discharge: 2023-11-25 | Disposition: A | Discharge status: Home / Self Care | Attending: Emergency Medicine | Admitting: Emergency Medicine

## 2023-11-25 DIAGNOSIS — R0602 Shortness of breath: Secondary | ICD-10-CM | POA: Diagnosis not present

## 2023-11-25 DIAGNOSIS — R079 Chest pain, unspecified: Secondary | ICD-10-CM

## 2023-11-25 DIAGNOSIS — Z91040 Latex allergy status: Secondary | ICD-10-CM | POA: Diagnosis not present

## 2023-11-25 DIAGNOSIS — R1013 Epigastric pain: Secondary | ICD-10-CM | POA: Insufficient documentation

## 2023-11-25 DIAGNOSIS — R0789 Other chest pain: Secondary | ICD-10-CM | POA: Diagnosis present

## 2023-11-25 LAB — CBC
HCT: 41.7 % (ref 36.0–46.0)
Hemoglobin: 14.7 g/dL (ref 12.0–15.0)
MCH: 31.7 pg (ref 26.0–34.0)
MCHC: 35.3 g/dL (ref 30.0–36.0)
MCV: 90.1 fL (ref 80.0–100.0)
Platelets: 434 10*3/uL — ABNORMAL HIGH (ref 150–400)
RBC: 4.63 MIL/uL (ref 3.87–5.11)
RDW: 13 % (ref 11.5–15.5)
WBC: 13.1 10*3/uL — ABNORMAL HIGH (ref 4.0–10.5)
nRBC: 0 % (ref 0.0–0.2)

## 2023-11-25 LAB — HEPATIC FUNCTION PANEL
ALT: 17 U/L (ref 0–44)
AST: 18 U/L (ref 15–41)
Albumin: 3.9 g/dL (ref 3.5–5.0)
Alkaline Phosphatase: 70 U/L (ref 38–126)
Bilirubin, Direct: 0.1 mg/dL (ref 0.0–0.2)
Indirect Bilirubin: 0.6 mg/dL (ref 0.3–0.9)
Total Bilirubin: 0.7 mg/dL (ref 0.0–1.2)
Total Protein: 7.8 g/dL (ref 6.5–8.1)

## 2023-11-25 LAB — BASIC METABOLIC PANEL WITH GFR
Anion gap: 8 (ref 5–15)
BUN: 16 mg/dL (ref 6–20)
CO2: 25 mmol/L (ref 22–32)
Calcium: 9.5 mg/dL (ref 8.9–10.3)
Chloride: 100 mmol/L (ref 98–111)
Creatinine, Ser: 0.87 mg/dL (ref 0.44–1.00)
GFR, Estimated: >60 mL/min (ref >60)
Glucose, Bld: 94 mg/dL (ref 70–99)
Potassium: 3.9 mmol/L (ref 3.5–5.1)
Sodium: 133 mmol/L — ABNORMAL LOW (ref 135–145)

## 2023-11-25 LAB — TROPONIN I (HIGH SENSITIVITY)
Troponin I (High Sensitivity): 4 ng/L (ref <18)
Troponin I (High Sensitivity): 3 ng/L (ref <18)

## 2023-11-25 MED ORDER — OXYCODONE HCL 5 MG PO TABS
10.0000 mg | ORAL_TABLET | Freq: Once | ORAL | Status: AC
  Administered 2023-11-25: 10 mg via ORAL
  Filled 2023-11-25: qty 2

## 2023-11-25 MED ORDER — ONDANSETRON HCL 4 MG/2ML IJ SOLN
4.0000 mg | Freq: Once | INTRAMUSCULAR | Status: AC
  Administered 2023-11-25: 4 mg via INTRAVENOUS
  Filled 2023-11-25: qty 2

## 2023-11-25 MED ORDER — MORPHINE SULFATE (PF) 4 MG/ML IV SOLN
4.0000 mg | Freq: Once | INTRAVENOUS | Status: AC
  Administered 2023-11-25: 4 mg via INTRAVENOUS
  Filled 2023-11-25: qty 1

## 2023-11-25 MED ORDER — LIDOCAINE VISCOUS HCL 2 % MT SOLN
15.0000 mL | Freq: Once | OROMUCOSAL | Status: AC
  Administered 2023-11-25: 15 mL via ORAL
  Filled 2023-11-25: qty 15

## 2023-11-25 MED ORDER — SUCRALFATE 1 GM/10ML PO SUSP: 1.0000 g | Freq: Three times a day (TID) | ORAL | 0 refills | Status: AC

## 2023-11-25 MED ORDER — PANTOPRAZOLE SODIUM 40 MG IV SOLR
40.0000 mg | Freq: Once | INTRAVENOUS | Status: AC
  Administered 2023-11-25: 40 mg via INTRAVENOUS
  Filled 2023-11-25: qty 10

## 2023-11-25 MED ORDER — ALUM & MAG HYDROXIDE-SIMETH 200-200-20 MG/5ML PO SUSP
30.0000 mL | Freq: Once | ORAL | Status: AC
  Administered 2023-11-25: 30 mL via ORAL
  Filled 2023-11-25: qty 30

## 2023-11-25 MED ORDER — OXYCODONE-ACETAMINOPHEN 5-325 MG PO TABS: 1.0000 | ORAL_TABLET | Freq: Three times a day (TID) | ORAL | 0 refills | Status: AC | PRN

## 2023-11-25 MED ORDER — IOHEXOL 350 MG/ML SOLN
100.0000 mL | Freq: Once | INTRAVENOUS | Status: AC | PRN
  Administered 2023-11-25: 100 mL via INTRAVENOUS

## 2023-11-25 MED ORDER — PANTOPRAZOLE SODIUM 40 MG PO TBEC: 40.0000 mg | DELAYED_RELEASE_TABLET | Freq: Every day | ORAL | 0 refills | Status: AC

## 2023-11-25 NOTE — ED Notes (Signed)
 Patient transported to CT

## 2023-11-25 NOTE — ED Triage Notes (Signed)
 Pt arrived from home via POV c/o chest pain 9/10 accompanied with sob. Pt states that her chest hurts worse each time she takes a deep breath. Described as sharp radiating to back and shoulder and left side of jaw. Chest pain began at 2100 that gradually became worse.

## 2023-11-26 NOTE — ED Provider Notes (Signed)
 [place] EMERGENCY DEPARTMENT AT [facility] Provider Note   CSN: [id number] Arrival date & time: 11/25/23  0046     History  Chief Complaint  Patient presents with   Chest Pain   Shortness of Breath    Nichole Aguilar is a 53 y.o. female.  45-year-old female with well-controlled Crohn's disease that presents ER today with epigastric pain that radiates up her chest.  States that sharp in nature but has a pressure component as well.  Maybe a little bit short of breath but deftly hurts worse when she takes a deep breath.  Some cough.  Some pain with movement.  States he is not having at this before.  No history of cardiac disease.  States that it radiates through towards her back.  Maybe some nausea.  No vomiting.  No diarrhea or constipation.   Chest Pain Associated symptoms: shortness of breath   Shortness of Breath Associated symptoms: chest pain        Home Medications Prior to Admission medications   Medication Sig Start Date End Date Taking? Authorizing Provider  oxyCODONE -acetaminophen  (PERCOCET) 5-325 MG tablet Take 1 tablet by mouth every 8 (eight) hours as needed for severe pain (pain score 7-10). 11/25/23  Yes Jaimie Redditt, Reymundo Caulk, MD  pantoprazole  (PROTONIX ) 40 MG tablet Take 1 tablet (40 mg total) by mouth daily. 11/25/23  Yes Haleemah Buckalew, Reymundo Caulk, MD  sucralfate  (CARAFATE ) 1 GM/10ML suspension Take 10 mLs (1 g total) by mouth 4 (four) times daily -  with meals and at bedtime. 11/25/23  Yes Johney Perotti, Reymundo Caulk, MD  amphetamine -dextroamphetamine (ADDERALL) 30 MG tablet Take 30 mg by mouth 2 (two) times daily. 12/09/20   [provider]  citalopram  (CELEXA ) 40 MG tablet Take 40 mg by mouth daily. 12/08/17   [provider]  estradiol (ESTRACE) 1 MG tablet Take 1 mg by mouth daily. 12/09/20   [provider]  HYDROcodone -acetaminophen  (NORCO) 5-325 MG tablet 1-2 tabs po q6 hours prn pain 12/29/20   Kuzma, Kevin, MD  naproxen  (NAPROSYN ) 500 MG tablet Take 1 tablet  (500 mg total) by mouth 2 (two) times daily with a meal. 01/08/22   Orvilla Blander, MD  OVER THE COUNTER MEDICATION Take 1 tablet by mouth daily. Medicore vitamin    [provider]  Semaglutide -Weight Management (WEGOVY ) 0.5 MG/0.5ML SOAJ Inject 0.5 mg into the skin once a week. 06/03/23     traMADol  (ULTRAM ) 50 MG tablet Take 1 tablet (50 mg total) by mouth every 6 (six) hours as needed. 01/08/22   Orvilla Blander, MD      Allergies    Codeine, Geodon [ziprasidone hcl], Wellbutrin [bupropion], Dilaudid [hydromorphone hcl], Latex, and Tagamet [cimetidine]    Review of Systems   Review of Systems  Respiratory:  Positive for shortness of breath.   Cardiovascular:  Positive for chest pain.    Physical Exam Updated Vital Signs BP 127/64   Pulse 80   Temp 97.9 F (36.6 C) (Oral)   Resp 17   Ht 5\' 1"  (1.549 m)   Wt 56.7 kg   SpO2 96%   BMI 23.62 kg/m  Physical Exam Vitals and nursing note reviewed.  Constitutional:      Appearance: She is well-developed.  HENT:     Head: Normocephalic and atraumatic.  Cardiovascular:     Rate and Rhythm: Normal rate and regular rhythm.  Pulmonary:     Effort: No respiratory distress.     Breath sounds: No stridor.  Abdominal:  General: There is no distension.     Tenderness: There is no guarding.     Comments: Epigastric pain  Musculoskeletal:     Cervical back: Normal range of motion.     Right lower leg: No edema.  Neurological:     Mental Status: She is alert.     ED Results / Procedures / Treatments   Labs (all labs ordered are listed, but only abnormal results are displayed) Labs Reviewed  BASIC METABOLIC PANEL WITH GFR - Abnormal; Notable for the following components:      Result Value   Sodium 133 (*)    All other components within normal limits  CBC - Abnormal; Notable for the following components:   WBC 13.1 (*)    Platelets 434 (*)    All other components within normal limits  HEPATIC FUNCTION PANEL  TROPONIN  I (HIGH SENSITIVITY)  TROPONIN I (HIGH SENSITIVITY)    EKG None  Radiology CT Angio Chest/Abd/Pel for Dissection W and/or Wo Contrast Result Date: 11/25/2023 CLINICAL DATA:  Acute aortic syndrome, chest pain, dyspnea EXAM: CT ANGIOGRAPHY CHEST, ABDOMEN AND PELVIS TECHNIQUE: Non-contrast CT of the chest was initially obtained. Multidetector CT imaging through the chest, abdomen and pelvis was performed using the standard protocol during bolus administration of intravenous contrast. Multiplanar reconstructed images and MIPs were obtained and reviewed to evaluate the vascular anatomy. RADIATION DOSE REDUCTION: This exam was performed according to the departmental dose-optimization program which includes automated exposure control, adjustment of the mA and/or kV according to patient size and/or use of iterative reconstruction technique. CONTRAST:  OMNIPAQUE  IOHEXOL  350 MG/ML SOLN COMPARISON:  01/08/2022 FINDINGS: CTA CHEST FINDINGS Cardiovascular: Preferential opacification of the thoracic aorta. No evidence of thoracic aortic aneurysm or dissection. Normal heart size. No pericardial effusion. Mediastinum/Nodes: No enlarged mediastinal, hilar, or axillary lymph nodes. Thyroid  gland, trachea, and esophagus demonstrate no significant findings. Lungs/Pleura: Lungs are clear. No pleural effusion or pneumothorax. Musculoskeletal: No chest wall abnormality. No acute or significant osseous findings. Review of the MIP images confirms the above findings. CTA ABDOMEN AND PELVIS FINDINGS VASCULAR Aorta: Normal caliber aorta without aneurysm, dissection, vasculitis or significant stenosis. Celiac: Patent without evidence of aneurysm, dissection, vasculitis or significant stenosis. SMA: Patent without evidence of aneurysm, dissection, vasculitis or significant stenosis. Renals: Both renal arteries are patent without evidence of aneurysm, dissection, vasculitis, fibromuscular dysplasia or significant stenosis. IMA:  Patent without evidence of aneurysm, dissection, vasculitis or significant stenosis. Inflow: Patent without evidence of aneurysm, dissection, vasculitis or significant stenosis. Veins: No obvious venous abnormality within the limitations of this arterial phase study. Review of the MIP images confirms the above findings. NON-VASCULAR Hepatobiliary: Status post cholecystectomy. Moderate intra and extrahepatic biliary ductal dilation is present with the extrahepatic bile duct measuring up to 14 mm in diameter, possibly representing post cholecystectomy change. The liver is otherwise unremarkable Pancreas: Unremarkable Spleen: Unremarkable Adrenals/Urinary Tract: Adrenal glands are unremarkable. Kidneys are normal, without renal calculi, focal lesion, or hydronephrosis. Bladder is unremarkable. Stomach/Bowel: Stomach is within normal limits. Appendix appears normal. No evidence of bowel wall thickening, distention, or inflammatory changes. Lymphatic: No pathologic adenopathy within the abdomen and pelvis. Reproductive: Status post hysterectomy. No adnexal masses. Other: No abdominal wall hernia or abnormality. No abdominopelvic ascites. Musculoskeletal: No acute bone abnormality. No lytic or blastic bone lesion. Osseous structures are age appropriate. Review of the MIP images confirms the above findings. IMPRESSION: 1. No evidence of aortic aneurysm, dissection, or significant stenosis. 2. Moderate intra and extrahepatic biliary  ductal dilation, possibly representing post cholecystectomy change. Correlation with liver function tests would be helpful to exclude an obstructive process. 3. Otherwise unremarkable examination of the chest, abdomen and pelvis. Electronically Signed   By: Worthy Heads M.D.   On: 11/25/2023 03:47   DG Chest 2 View Result Date: 11/25/2023 CLINICAL DATA:  Chest pain EXAM: CHEST - 2 VIEW COMPARISON:  None Available. FINDINGS: The heart size and mediastinal contours are within normal limits.  Both lungs are clear. The visualized skeletal structures are unremarkable. IMPRESSION: No active cardiopulmonary disease. Electronically Signed   By: Worthy Heads M.D.   On: 11/25/2023 02:04    Procedures Procedures    Medications Ordered in ED Medications  morphine  (PF) 4 MG/ML injection 4 mg (4 mg Intravenous Given 11/25/23 0219)  ondansetron  (ZOFRAN ) injection 4 mg (4 mg Intravenous Given 11/25/23 0219)  iohexol  (OMNIPAQUE ) 350 MG/ML injection 100 mL (100 mLs Intravenous Contrast Given 11/25/23 0254)  morphine  (PF) 4 MG/ML injection 4 mg (4 mg Intravenous Given 11/25/23 0312)  alum & mag hydroxide-simeth (MAALOX/MYLANTA) 200-200-20 MG/5ML suspension 30 mL (30 mLs Oral Given 11/25/23 0547)    And  lidocaine  (XYLOCAINE ) 2 % viscous mouth solution 15 mL (15 mLs Oral Given 11/25/23 0547)  pantoprazole  (PROTONIX ) injection 40 mg (40 mg Intravenous Given 11/25/23 0546)  oxyCODONE  (Oxy IR/ROXICODONE ) immediate release tablet 10 mg (10 mg Oral Given 11/25/23 0544)    ED Course/ Medical Decision Making/ A&P                                 Medical Decision Making Amount and/or Complexity of Data Reviewed Labs: ordered. Radiology: ordered.  Risk OTC drugs. Prescription drug management.   Frontal for patient's quite large.  Her pain seems to be in the epigastric area radiating up.  Consider peptic ulcer disease, gastritis, pancreatitis, retained gallstone versus bile leak, hepatitis, Crohn's exacerbation, pulmonary embolus, ACS.  Also considered aortic dissection as her blood pressure was little bit elevated when she got here and is radiate towards her back and up from her epigastric area.  Ultimately liver function labs were normal, ACS was ruled out with EKG and troponins, PE and dissection, pneumonia, pneumothorax, pneumomediastinum were all ruled out with CT angio.  After the CT angio pain began to migrate over towards her right upper quadrant.  I do not know if this is from the morphine  that  she had may be causing some sphincter of Oddi dysfunction and bile backup versus peptic ulcer disease.  At this time with emergencies ruled out we will treat her for peptic ulcer disease and she has follow-up with her GI coming up but she will call and see if she can get it sooner.  Return here for new or worsening symptoms.         Final Clinical Impression(s) / ED Diagnoses Final diagnoses:  Chest pain, unspecified type    Rx / DC Orders ED Discharge Orders          Ordered    pantoprazole  (PROTONIX ) 40 MG tablet  Daily        11/25/23 0607    sucralfate  (CARAFATE ) 1 GM/10ML suspension  3 times daily with meals & bedtime        11/25/23 0607    oxyCODONE -acetaminophen  (PERCOCET) 5-325 MG tablet  Every 8 hours PRN        11/25/23 0615  Diandra Cimini, Reymundo Caulk, MD 11/26/23 [phone]
